# Patient Record
Sex: Female | Born: 1935
Health system: Southern US, Community
[De-identification: ages and names within clinical notes are randomized; demographics above are authoritative.]

## PROBLEM LIST (undated history)

## (undated) DIAGNOSIS — I10 Essential (primary) hypertension: Secondary | ICD-10-CM

## (undated) DIAGNOSIS — F0280 Dementia in other diseases classified elsewhere without behavioral disturbance: Secondary | ICD-10-CM

## (undated) DIAGNOSIS — K754 Autoimmune hepatitis: Secondary | ICD-10-CM

## (undated) DIAGNOSIS — E119 Type 2 diabetes mellitus without complications: Secondary | ICD-10-CM

## (undated) DIAGNOSIS — G309 Alzheimer's disease, unspecified: Secondary | ICD-10-CM

## (undated) DIAGNOSIS — M810 Age-related osteoporosis without current pathological fracture: Secondary | ICD-10-CM

## (undated) HISTORY — DX: Essential (primary) hypertension: I10

## (undated) HISTORY — DX: Age-related osteoporosis without current pathological fracture: M81.0

## (undated) HISTORY — DX: Dementia in other diseases classified elsewhere without behavioral disturbance: F02.80

## (undated) HISTORY — DX: Type 2 diabetes mellitus without complications: E11.9

## (undated) HISTORY — DX: Alzheimer's disease, unspecified: G30.9

## (undated) HISTORY — DX: Autoimmune hepatitis: K75.4

---

## 2011-05-04 DIAGNOSIS — E7800 Pure hypercholesterolemia, unspecified: Secondary | ICD-10-CM | POA: Diagnosis not present

## 2011-05-04 DIAGNOSIS — E119 Type 2 diabetes mellitus without complications: Secondary | ICD-10-CM | POA: Diagnosis not present

## 2011-05-04 DIAGNOSIS — Z124 Encounter for screening for malignant neoplasm of cervix: Secondary | ICD-10-CM | POA: Diagnosis not present

## 2011-06-14 DIAGNOSIS — H524 Presbyopia: Secondary | ICD-10-CM | POA: Diagnosis not present

## 2011-06-14 DIAGNOSIS — Z961 Presence of intraocular lens: Secondary | ICD-10-CM | POA: Diagnosis not present

## 2011-06-14 DIAGNOSIS — H35349 Macular cyst, hole, or pseudohole, unspecified eye: Secondary | ICD-10-CM | POA: Diagnosis not present

## 2011-08-03 DIAGNOSIS — H43819 Vitreous degeneration, unspecified eye: Secondary | ICD-10-CM | POA: Diagnosis not present

## 2011-08-03 DIAGNOSIS — H35349 Macular cyst, hole, or pseudohole, unspecified eye: Secondary | ICD-10-CM | POA: Diagnosis not present

## 2011-08-07 DIAGNOSIS — E7800 Pure hypercholesterolemia, unspecified: Secondary | ICD-10-CM | POA: Diagnosis not present

## 2011-08-07 DIAGNOSIS — I10 Essential (primary) hypertension: Secondary | ICD-10-CM | POA: Diagnosis not present

## 2011-08-07 DIAGNOSIS — E119 Type 2 diabetes mellitus without complications: Secondary | ICD-10-CM | POA: Diagnosis not present

## 2012-01-10 DIAGNOSIS — N952 Postmenopausal atrophic vaginitis: Secondary | ICD-10-CM | POA: Diagnosis not present

## 2012-01-10 DIAGNOSIS — Z1212 Encounter for screening for malignant neoplasm of rectum: Secondary | ICD-10-CM | POA: Diagnosis not present

## 2012-01-10 DIAGNOSIS — Z9189 Other specified personal risk factors, not elsewhere classified: Secondary | ICD-10-CM | POA: Diagnosis not present

## 2012-02-08 DIAGNOSIS — Z1231 Encounter for screening mammogram for malignant neoplasm of breast: Secondary | ICD-10-CM | POA: Diagnosis not present

## 2012-02-23 DIAGNOSIS — E119 Type 2 diabetes mellitus without complications: Secondary | ICD-10-CM | POA: Diagnosis not present

## 2012-02-23 DIAGNOSIS — R142 Eructation: Secondary | ICD-10-CM | POA: Diagnosis not present

## 2012-02-23 DIAGNOSIS — R7989 Other specified abnormal findings of blood chemistry: Secondary | ICD-10-CM | POA: Diagnosis not present

## 2012-02-23 DIAGNOSIS — R141 Gas pain: Secondary | ICD-10-CM | POA: Diagnosis not present

## 2012-02-23 DIAGNOSIS — N390 Urinary tract infection, site not specified: Secondary | ICD-10-CM | POA: Diagnosis not present

## 2012-02-23 DIAGNOSIS — I10 Essential (primary) hypertension: Secondary | ICD-10-CM | POA: Diagnosis not present

## 2012-02-26 DIAGNOSIS — R17 Unspecified jaundice: Secondary | ICD-10-CM | POA: Diagnosis not present

## 2012-02-26 DIAGNOSIS — E119 Type 2 diabetes mellitus without complications: Secondary | ICD-10-CM | POA: Diagnosis not present

## 2012-02-26 DIAGNOSIS — K769 Liver disease, unspecified: Secondary | ICD-10-CM | POA: Diagnosis not present

## 2012-02-26 DIAGNOSIS — R799 Abnormal finding of blood chemistry, unspecified: Secondary | ICD-10-CM | POA: Diagnosis not present

## 2012-02-26 DIAGNOSIS — K7689 Other specified diseases of liver: Secondary | ICD-10-CM | POA: Diagnosis not present

## 2012-02-26 DIAGNOSIS — Z79899 Other long term (current) drug therapy: Secondary | ICD-10-CM | POA: Diagnosis not present

## 2012-02-26 DIAGNOSIS — R413 Other amnesia: Secondary | ICD-10-CM | POA: Diagnosis not present

## 2012-02-26 DIAGNOSIS — Z01818 Encounter for other preprocedural examination: Secondary | ICD-10-CM | POA: Diagnosis not present

## 2012-02-26 DIAGNOSIS — K7200 Acute and subacute hepatic failure without coma: Secondary | ICD-10-CM | POA: Diagnosis not present

## 2012-02-27 DIAGNOSIS — K8020 Calculus of gallbladder without cholecystitis without obstruction: Secondary | ICD-10-CM | POA: Diagnosis not present

## 2012-02-27 DIAGNOSIS — K5730 Diverticulosis of large intestine without perforation or abscess without bleeding: Secondary | ICD-10-CM | POA: Diagnosis not present

## 2012-02-27 DIAGNOSIS — K7200 Acute and subacute hepatic failure without coma: Secondary | ICD-10-CM | POA: Diagnosis not present

## 2012-02-27 DIAGNOSIS — K7689 Other specified diseases of liver: Secondary | ICD-10-CM | POA: Diagnosis not present

## 2012-02-27 DIAGNOSIS — R17 Unspecified jaundice: Secondary | ICD-10-CM | POA: Diagnosis not present

## 2012-02-27 DIAGNOSIS — K769 Liver disease, unspecified: Secondary | ICD-10-CM | POA: Diagnosis not present

## 2012-02-27 DIAGNOSIS — Z79899 Other long term (current) drug therapy: Secondary | ICD-10-CM | POA: Diagnosis not present

## 2012-02-27 DIAGNOSIS — K449 Diaphragmatic hernia without obstruction or gangrene: Secondary | ICD-10-CM | POA: Diagnosis not present

## 2012-02-27 DIAGNOSIS — R413 Other amnesia: Secondary | ICD-10-CM | POA: Diagnosis not present

## 2012-02-27 DIAGNOSIS — D259 Leiomyoma of uterus, unspecified: Secondary | ICD-10-CM | POA: Diagnosis not present

## 2012-02-29 DIAGNOSIS — Z01818 Encounter for other preprocedural examination: Secondary | ICD-10-CM | POA: Diagnosis not present

## 2012-02-29 DIAGNOSIS — K8020 Calculus of gallbladder without cholecystitis without obstruction: Secondary | ICD-10-CM | POA: Diagnosis not present

## 2012-02-29 DIAGNOSIS — K769 Liver disease, unspecified: Secondary | ICD-10-CM | POA: Diagnosis not present

## 2012-02-29 DIAGNOSIS — R935 Abnormal findings on diagnostic imaging of other abdominal regions, including retroperitoneum: Secondary | ICD-10-CM | POA: Diagnosis not present

## 2012-02-29 DIAGNOSIS — R17 Unspecified jaundice: Secondary | ICD-10-CM | POA: Diagnosis not present

## 2012-02-29 DIAGNOSIS — Z79899 Other long term (current) drug therapy: Secondary | ICD-10-CM | POA: Diagnosis not present

## 2012-02-29 DIAGNOSIS — R7989 Other specified abnormal findings of blood chemistry: Secondary | ICD-10-CM | POA: Diagnosis not present

## 2012-03-04 DIAGNOSIS — K754 Autoimmune hepatitis: Secondary | ICD-10-CM | POA: Diagnosis not present

## 2012-03-04 DIAGNOSIS — R17 Unspecified jaundice: Secondary | ICD-10-CM | POA: Diagnosis not present

## 2012-03-04 DIAGNOSIS — R945 Abnormal results of liver function studies: Secondary | ICD-10-CM | POA: Diagnosis not present

## 2012-03-11 DIAGNOSIS — K754 Autoimmune hepatitis: Secondary | ICD-10-CM | POA: Diagnosis not present

## 2012-03-11 DIAGNOSIS — E119 Type 2 diabetes mellitus without complications: Secondary | ICD-10-CM | POA: Diagnosis not present

## 2012-03-11 DIAGNOSIS — E785 Hyperlipidemia, unspecified: Secondary | ICD-10-CM | POA: Diagnosis not present

## 2012-03-11 DIAGNOSIS — Z23 Encounter for immunization: Secondary | ICD-10-CM | POA: Diagnosis not present

## 2012-03-11 DIAGNOSIS — Z1331 Encounter for screening for depression: Secondary | ICD-10-CM | POA: Diagnosis not present

## 2012-03-11 DIAGNOSIS — Z0000 Encounter for general adult medical examination without abnormal findings: Secondary | ICD-10-CM | POA: Diagnosis not present

## 2012-03-13 DIAGNOSIS — K754 Autoimmune hepatitis: Secondary | ICD-10-CM | POA: Diagnosis not present

## 2012-03-13 DIAGNOSIS — Z23 Encounter for immunization: Secondary | ICD-10-CM | POA: Diagnosis not present

## 2012-03-13 DIAGNOSIS — E119 Type 2 diabetes mellitus without complications: Secondary | ICD-10-CM | POA: Diagnosis not present

## 2012-03-13 DIAGNOSIS — Z1331 Encounter for screening for depression: Secondary | ICD-10-CM | POA: Diagnosis not present

## 2012-03-13 DIAGNOSIS — Z0000 Encounter for general adult medical examination without abnormal findings: Secondary | ICD-10-CM | POA: Diagnosis not present

## 2012-03-13 DIAGNOSIS — E785 Hyperlipidemia, unspecified: Secondary | ICD-10-CM | POA: Diagnosis not present

## 2012-03-18 DIAGNOSIS — K754 Autoimmune hepatitis: Secondary | ICD-10-CM | POA: Diagnosis not present

## 2012-03-19 DIAGNOSIS — K754 Autoimmune hepatitis: Secondary | ICD-10-CM | POA: Diagnosis not present

## 2012-03-25 DIAGNOSIS — K754 Autoimmune hepatitis: Secondary | ICD-10-CM | POA: Diagnosis not present

## 2012-03-25 DIAGNOSIS — Z79899 Other long term (current) drug therapy: Secondary | ICD-10-CM | POA: Diagnosis not present

## 2012-04-09 DIAGNOSIS — K754 Autoimmune hepatitis: Secondary | ICD-10-CM | POA: Diagnosis not present

## 2012-04-15 DIAGNOSIS — K754 Autoimmune hepatitis: Secondary | ICD-10-CM | POA: Diagnosis not present

## 2012-04-29 DIAGNOSIS — K754 Autoimmune hepatitis: Secondary | ICD-10-CM | POA: Diagnosis not present

## 2012-05-13 DIAGNOSIS — I10 Essential (primary) hypertension: Secondary | ICD-10-CM | POA: Diagnosis not present

## 2012-05-13 DIAGNOSIS — Z79899 Other long term (current) drug therapy: Secondary | ICD-10-CM | POA: Diagnosis not present

## 2012-05-16 DIAGNOSIS — K754 Autoimmune hepatitis: Secondary | ICD-10-CM | POA: Diagnosis not present

## 2012-06-19 DIAGNOSIS — E785 Hyperlipidemia, unspecified: Secondary | ICD-10-CM | POA: Diagnosis not present

## 2012-06-19 DIAGNOSIS — E119 Type 2 diabetes mellitus without complications: Secondary | ICD-10-CM | POA: Diagnosis not present

## 2012-06-21 DIAGNOSIS — H524 Presbyopia: Secondary | ICD-10-CM | POA: Diagnosis not present

## 2012-07-24 DIAGNOSIS — E785 Hyperlipidemia, unspecified: Secondary | ICD-10-CM | POA: Diagnosis not present

## 2012-07-24 DIAGNOSIS — E119 Type 2 diabetes mellitus without complications: Secondary | ICD-10-CM | POA: Diagnosis not present

## 2012-07-24 DIAGNOSIS — G309 Alzheimer's disease, unspecified: Secondary | ICD-10-CM | POA: Diagnosis not present

## 2012-07-31 DIAGNOSIS — K754 Autoimmune hepatitis: Secondary | ICD-10-CM | POA: Diagnosis not present

## 2012-08-01 DIAGNOSIS — E119 Type 2 diabetes mellitus without complications: Secondary | ICD-10-CM | POA: Diagnosis not present

## 2012-08-01 DIAGNOSIS — Z9889 Other specified postprocedural states: Secondary | ICD-10-CM | POA: Diagnosis not present

## 2012-08-01 DIAGNOSIS — H35349 Macular cyst, hole, or pseudohole, unspecified eye: Secondary | ICD-10-CM | POA: Diagnosis not present

## 2012-08-19 DIAGNOSIS — E559 Vitamin D deficiency, unspecified: Secondary | ICD-10-CM | POA: Diagnosis not present

## 2012-08-19 DIAGNOSIS — Z79899 Other long term (current) drug therapy: Secondary | ICD-10-CM | POA: Diagnosis not present

## 2012-08-29 DIAGNOSIS — E1149 Type 2 diabetes mellitus with other diabetic neurological complication: Secondary | ICD-10-CM | POA: Diagnosis not present

## 2012-08-29 DIAGNOSIS — E1129 Type 2 diabetes mellitus with other diabetic kidney complication: Secondary | ICD-10-CM | POA: Diagnosis not present

## 2012-08-29 DIAGNOSIS — E7800 Pure hypercholesterolemia, unspecified: Secondary | ICD-10-CM | POA: Diagnosis not present

## 2012-08-29 DIAGNOSIS — F0280 Dementia in other diseases classified elsewhere without behavioral disturbance: Secondary | ICD-10-CM | POA: Diagnosis not present

## 2012-10-02 DIAGNOSIS — R809 Proteinuria, unspecified: Secondary | ICD-10-CM | POA: Diagnosis not present

## 2012-10-08 DIAGNOSIS — K754 Autoimmune hepatitis: Secondary | ICD-10-CM | POA: Diagnosis not present

## 2012-11-18 DIAGNOSIS — K754 Autoimmune hepatitis: Secondary | ICD-10-CM | POA: Diagnosis not present

## 2012-11-18 DIAGNOSIS — D126 Benign neoplasm of colon, unspecified: Secondary | ICD-10-CM | POA: Diagnosis not present

## 2012-11-29 DIAGNOSIS — Z23 Encounter for immunization: Secondary | ICD-10-CM | POA: Diagnosis not present

## 2012-11-29 DIAGNOSIS — G4700 Insomnia, unspecified: Secondary | ICD-10-CM | POA: Diagnosis not present

## 2012-11-29 DIAGNOSIS — E785 Hyperlipidemia, unspecified: Secondary | ICD-10-CM | POA: Diagnosis not present

## 2012-11-29 DIAGNOSIS — E1129 Type 2 diabetes mellitus with other diabetic kidney complication: Secondary | ICD-10-CM | POA: Diagnosis not present

## 2013-01-15 DIAGNOSIS — E1129 Type 2 diabetes mellitus with other diabetic kidney complication: Secondary | ICD-10-CM | POA: Diagnosis not present

## 2013-01-15 DIAGNOSIS — Z79899 Other long term (current) drug therapy: Secondary | ICD-10-CM | POA: Diagnosis not present

## 2013-01-15 DIAGNOSIS — K754 Autoimmune hepatitis: Secondary | ICD-10-CM | POA: Diagnosis not present

## 2013-02-11 DIAGNOSIS — Z1231 Encounter for screening mammogram for malignant neoplasm of breast: Secondary | ICD-10-CM | POA: Diagnosis not present

## 2013-02-11 DIAGNOSIS — M810 Age-related osteoporosis without current pathological fracture: Secondary | ICD-10-CM | POA: Diagnosis not present

## 2013-02-13 DIAGNOSIS — K754 Autoimmune hepatitis: Secondary | ICD-10-CM | POA: Diagnosis not present

## 2013-02-13 DIAGNOSIS — K5730 Diverticulosis of large intestine without perforation or abscess without bleeding: Secondary | ICD-10-CM | POA: Diagnosis not present

## 2013-02-13 DIAGNOSIS — D126 Benign neoplasm of colon, unspecified: Secondary | ICD-10-CM | POA: Diagnosis not present

## 2013-02-20 DIAGNOSIS — M810 Age-related osteoporosis without current pathological fracture: Secondary | ICD-10-CM | POA: Diagnosis not present

## 2013-02-25 DIAGNOSIS — K769 Liver disease, unspecified: Secondary | ICD-10-CM | POA: Diagnosis not present

## 2013-02-26 DIAGNOSIS — K5730 Diverticulosis of large intestine without perforation or abscess without bleeding: Secondary | ICD-10-CM | POA: Diagnosis not present

## 2013-02-26 DIAGNOSIS — D126 Benign neoplasm of colon, unspecified: Secondary | ICD-10-CM | POA: Diagnosis not present

## 2013-02-26 DIAGNOSIS — K754 Autoimmune hepatitis: Secondary | ICD-10-CM | POA: Diagnosis not present

## 2013-03-04 DIAGNOSIS — Z1212 Encounter for screening for malignant neoplasm of rectum: Secondary | ICD-10-CM | POA: Diagnosis not present

## 2013-03-04 DIAGNOSIS — E119 Type 2 diabetes mellitus without complications: Secondary | ICD-10-CM | POA: Diagnosis not present

## 2013-03-04 DIAGNOSIS — Z01419 Encounter for gynecological examination (general) (routine) without abnormal findings: Secondary | ICD-10-CM | POA: Diagnosis not present

## 2013-03-13 DIAGNOSIS — M810 Age-related osteoporosis without current pathological fracture: Secondary | ICD-10-CM | POA: Diagnosis not present

## 2013-03-21 DIAGNOSIS — Z1331 Encounter for screening for depression: Secondary | ICD-10-CM | POA: Diagnosis not present

## 2013-03-21 DIAGNOSIS — F0280 Dementia in other diseases classified elsewhere without behavioral disturbance: Secondary | ICD-10-CM | POA: Diagnosis not present

## 2013-03-21 DIAGNOSIS — Z0000 Encounter for general adult medical examination without abnormal findings: Secondary | ICD-10-CM | POA: Diagnosis not present

## 2013-03-21 DIAGNOSIS — K754 Autoimmune hepatitis: Secondary | ICD-10-CM | POA: Diagnosis not present

## 2013-03-21 DIAGNOSIS — I10 Essential (primary) hypertension: Secondary | ICD-10-CM | POA: Diagnosis not present

## 2013-03-21 DIAGNOSIS — K769 Liver disease, unspecified: Secondary | ICD-10-CM | POA: Diagnosis not present

## 2013-03-21 DIAGNOSIS — E1129 Type 2 diabetes mellitus with other diabetic kidney complication: Secondary | ICD-10-CM | POA: Diagnosis not present

## 2013-04-21 DIAGNOSIS — G309 Alzheimer's disease, unspecified: Secondary | ICD-10-CM | POA: Diagnosis not present

## 2013-04-21 DIAGNOSIS — F0280 Dementia in other diseases classified elsewhere without behavioral disturbance: Secondary | ICD-10-CM | POA: Diagnosis not present

## 2013-05-19 DIAGNOSIS — F0280 Dementia in other diseases classified elsewhere without behavioral disturbance: Secondary | ICD-10-CM | POA: Diagnosis not present

## 2013-05-23 DIAGNOSIS — K5730 Diverticulosis of large intestine without perforation or abscess without bleeding: Secondary | ICD-10-CM | POA: Diagnosis not present

## 2013-05-23 DIAGNOSIS — K754 Autoimmune hepatitis: Secondary | ICD-10-CM | POA: Diagnosis not present

## 2013-05-23 DIAGNOSIS — D126 Benign neoplasm of colon, unspecified: Secondary | ICD-10-CM | POA: Diagnosis not present

## 2013-05-23 LAB — CBC AND DIFFERENTIAL
HEMOGLOBIN: 14.6 g/dL (ref 12.0–16.0)
Platelets: 202 10*3/uL (ref 150–399)
WBC: 7.2 10*3/mL

## 2013-05-23 LAB — BASIC METABOLIC PANEL
Creatinine: 1.1 mg/dL (ref 0.5–1.1)
Glucose: 170 mg/dL
Potassium: 4.1 mmol/L (ref 3.4–5.3)

## 2013-05-23 LAB — HEPATIC FUNCTION PANEL
ALT: 18 U/L (ref 7–35)
AST: 14 U/L (ref 13–35)
Bilirubin, Total: 0.5 mg/dL

## 2013-05-23 LAB — HEMOGLOBIN A1C: Hgb A1c MFr Bld: 6.1 % — AB (ref 4.0–6.0)

## 2013-11-21 ENCOUNTER — Ambulatory Visit (INDEPENDENT_AMBULATORY_CARE_PROVIDER_SITE_OTHER): Payer: Medicare Other | Admitting: Family Medicine

## 2013-11-21 ENCOUNTER — Encounter: Payer: Self-pay | Admitting: Family Medicine

## 2013-11-21 ENCOUNTER — Telehealth: Payer: Self-pay | Admitting: *Deleted

## 2013-11-21 VITALS — BP 144/84 | HR 83 | Wt 161.0 lb

## 2013-11-21 DIAGNOSIS — I10 Essential (primary) hypertension: Secondary | ICD-10-CM

## 2013-11-21 DIAGNOSIS — F0280 Dementia in other diseases classified elsewhere without behavioral disturbance: Secondary | ICD-10-CM

## 2013-11-21 DIAGNOSIS — K754 Autoimmune hepatitis: Secondary | ICD-10-CM

## 2013-11-21 DIAGNOSIS — Z79624 Long term (current) use of inhibitors of nucleotide synthesis: Secondary | ICD-10-CM

## 2013-11-21 DIAGNOSIS — M810 Age-related osteoporosis without current pathological fracture: Secondary | ICD-10-CM

## 2013-11-21 DIAGNOSIS — N058 Unspecified nephritic syndrome with other morphologic changes: Secondary | ICD-10-CM

## 2013-11-21 DIAGNOSIS — E119 Type 2 diabetes mellitus without complications: Secondary | ICD-10-CM

## 2013-11-21 DIAGNOSIS — G309 Alzheimer's disease, unspecified: Secondary | ICD-10-CM

## 2013-11-21 DIAGNOSIS — E1121 Type 2 diabetes mellitus with diabetic nephropathy: Secondary | ICD-10-CM

## 2013-11-21 DIAGNOSIS — G4700 Insomnia, unspecified: Secondary | ICD-10-CM

## 2013-11-21 DIAGNOSIS — E1129 Type 2 diabetes mellitus with other diabetic kidney complication: Secondary | ICD-10-CM

## 2013-11-21 DIAGNOSIS — Z79899 Other long term (current) drug therapy: Secondary | ICD-10-CM

## 2013-11-21 HISTORY — DX: Type 2 diabetes mellitus without complications: E11.9

## 2013-11-21 HISTORY — DX: Dementia in other diseases classified elsewhere, unspecified severity, without behavioral disturbance, psychotic disturbance, mood disturbance, and anxiety: F02.80

## 2013-11-21 HISTORY — DX: Essential (primary) hypertension: I10

## 2013-11-21 HISTORY — DX: Alzheimer's disease, unspecified: G30.9

## 2013-11-21 HISTORY — DX: Autoimmune hepatitis: K75.4

## 2013-11-21 HISTORY — DX: Age-related osteoporosis without current pathological fracture: M81.0

## 2013-11-21 NOTE — Telephone Encounter (Signed)
Pt's name was entered incorrectly on chart so had to reorder labs with correct orders

## 2013-11-21 NOTE — Progress Notes (Signed)
CC: Danielle Mcdowell is a 80 y.o. female is here for Establish Care   Subjective: HPI:  Pleasant 80 year old here to establish care, she is overall in familiar with her past medical history however she does bring in most recent office notes from her physician in [place]  History autoimmune hepatitis currently on azathioprine. She's unsure how long she's been on this medication denies any known intolerance or side effects. She believes that her hepatitis was found on routine blood work and that she was never symptomatic. She denies jaundice, right upper quadrant pain nausea nor unintentional weight loss. She believes it's been over 6 months and blood work was obtained last  History of type 2 diabetes: No outside blood sugars to report. Outside records reflect she's got diabetic nephropathy but she does not believe she has any other complications due to 2 diabetes. Currently not on any antihyperglycemic medication and she is uncertain when she was last taking any anti-hyperglycemics. Denies polyuria polyphasia or polydipsia  History of Alzheimer's disease: Currently taking donepezil on a daily basis without known side effects or known intolerance. She is independent on all ADLs but lives with her daughter here in [place]. She recognizes that she has a problem with memory but does not interfere with her quality of life  History of essential hypertension: She is uncertain how long she's had this, no outside blood pressures to report. She's been taking Cozaar for an unknown time and currently takes on a daily basis.  History of insomnia: She's been prescribed trazodone in the past which she's used for insomnia but states currently she is not having any difficulty with insomnia, from what she recalls it sounds like she's not taking trazodone currently  History of osteoporosis with an allergy to Fosamax, unknown reaction. She is uncertain about most recent DEXA scan, we are requesting outside  records  Review of Systems - General ROS: negative for - chills, fever, night sweats, weight gain or weight loss Ophthalmic ROS: negative for - decreased vision Psychological ROS: negative for - anxiety or depression ENT ROS: negative for - hearing change, nasal congestion, tinnitus or allergies Hematological and Lymphatic ROS: negative for - bleeding problems, bruising or swollen lymph nodes Breast ROS: negative Respiratory ROS: no cough, shortness of breath, or wheezing Cardiovascular ROS: no chest pain or dyspnea on exertion Gastrointestinal ROS: no abdominal pain, change in bowel habits, or black or bloody stools Genito-Urinary ROS: negative for - genital discharge, genital ulcers, incontinence or abnormal bleeding from genitals Musculoskeletal ROS: negative for - joint pain or muscle pain Neurological ROS: negative for - headaches or memory loss Dermatological ROS: negative for lumps, mole changes, rash and skin lesion changes  Past Medical History  Diagnosis Date  . Osteoporosis 11/21/2013  . Type 2 diabetes mellitus 11/21/2013    History reviewed. No pertinent past surgical history. Family History  Problem Relation Age of Onset  . Diabetes      History   Social History  . Marital Status: Widowed    Spouse Name: N/A    Number of Children: N/A  . Years of Education: N/A   Occupational History  . Not on file.   Social History Main Topics  . Smoking status: Never Smoker   . Smokeless tobacco: Not on file  . Alcohol Use: Not on file  . Drug Use: No  . Sexual Activity: No   Other Topics Concern  . Not on file   Social History Narrative  . No narrative on file  Objective: BP 144/84  Pulse 83  Wt 161 lb (73.029 kg)  General: Alert and Oriented, No Acute Distress HEENT: Pupils equal, round, reactive to light. Conjunctivae clear.  Moist mucous membranes pharynx unremarkable Lungs: Clear to auscultation bilaterally, no wheezing/ronchi/rales.  Comfortable work of  breathing. Good air movement. Cardiac: Regular rate and rhythm. Normal S1/S2.  No murmurs, rubs, nor gallops.   Abdomen: Soft nontender Extremities: No peripheral edema.  Strong peripheral pulses.  Mental Status: No depression, anxiety, nor agitation. Skin: Warm and dry.  Assessment & Plan: Danielle Mcdowell was seen today for establish care.  Diagnoses and associated orders for this visit:  Autoimmune hepatitis - COMPLETE METABOLIC PANEL WITH GFR - CBC  Type 2 diabetes mellitus without complication - HgB L5B  Encounter for long term current azathioprine therapy - CBC  Alzheimer's disease  Diabetic nephropathy associated with type 2 diabetes mellitus  Essential hypertension, benign  Insomnia  Osteoporosis    Autoimmune hepatitis: Clinically stable, Checking liver function today along with CBC given use of azathioprine. Type 2 diabetes: Clinically stable due for A1c, checking renal function Alzheimer's disease: Stable, Continue donepezil Essential hypertension: Controlled today in the office, before increasing losartan I would like to see outside blood pressures at home and in her former records Insomnia: Stable continue as needed trazodone Osteoporosis: Clinically stable but requesting outside records for most recent DEXA scan    Return in about 3 months (around 02/21/2014) for Routine BP Follow Up.

## 2013-11-22 LAB — COMPLETE METABOLIC PANEL WITH GFR
ALT: 9 U/L (ref 0–35)
AST: 14 U/L (ref 0–37)
Albumin: 4.2 g/dL (ref 3.5–5.2)
Alkaline Phosphatase: 61 U/L (ref 39–117)
BUN: 12 mg/dL (ref 6–23)
CO2: 25 mEq/L (ref 19–32)
Calcium: 9.6 mg/dL (ref 8.4–10.5)
Chloride: 104 mEq/L (ref 96–112)
Creat: 1.04 mg/dL (ref 0.50–1.10)
GFR, EST NON AFRICAN AMERICAN: 51 mL/min — AB
GFR, Est African American: 59 mL/min — ABNORMAL LOW
GLUCOSE: 83 mg/dL (ref 70–99)
Potassium: 4 mEq/L (ref 3.5–5.3)
Sodium: 140 mEq/L (ref 135–145)
Total Bilirubin: 0.5 mg/dL (ref 0.2–1.2)
Total Protein: 7.2 g/dL (ref 6.0–8.3)

## 2013-11-22 LAB — CBC
HCT: 42.9 % (ref 36.0–46.0)
Hemoglobin: 14.9 g/dL (ref 12.0–15.0)
MCH: 30.7 pg (ref 26.0–34.0)
MCHC: 34.7 g/dL (ref 30.0–36.0)
MCV: 88.3 fL (ref 78.0–100.0)
PLATELETS: 224 10*3/uL (ref 150–400)
RBC: 4.86 MIL/uL (ref 3.87–5.11)
RDW: 13.7 % (ref 11.5–15.5)
WBC: 10.1 10*3/uL (ref 4.0–10.5)

## 2013-11-22 LAB — HEMOGLOBIN A1C
HEMOGLOBIN A1C: 6.4 % — AB (ref <5.7)
MEAN PLASMA GLUCOSE: 137 mg/dL — AB (ref <117)

## 2013-11-24 ENCOUNTER — Encounter: Payer: Self-pay | Admitting: Family Medicine

## 2013-11-24 DIAGNOSIS — E1129 Type 2 diabetes mellitus with other diabetic kidney complication: Secondary | ICD-10-CM | POA: Insufficient documentation

## 2013-11-25 ENCOUNTER — Encounter: Payer: Self-pay | Admitting: Family Medicine

## 2013-11-25 DIAGNOSIS — E785 Hyperlipidemia, unspecified: Secondary | ICD-10-CM | POA: Insufficient documentation

## 2013-11-25 DIAGNOSIS — D126 Benign neoplasm of colon, unspecified: Secondary | ICD-10-CM | POA: Insufficient documentation

## 2013-12-05 ENCOUNTER — Encounter: Payer: Self-pay | Admitting: Family Medicine

## 2013-12-08 ENCOUNTER — Telehealth: Payer: Self-pay | Admitting: *Deleted

## 2013-12-08 DIAGNOSIS — G309 Alzheimer's disease, unspecified: Principal | ICD-10-CM

## 2013-12-08 DIAGNOSIS — F0280 Dementia in other diseases classified elsewhere without behavioral disturbance: Secondary | ICD-10-CM

## 2013-12-08 MED ORDER — DONEPEZIL HCL 5 MG PO TABS: 5.0000 mg | ORAL_TABLET | Freq: Every day | ORAL | Status: DC

## 2013-12-08 NOTE — Telephone Encounter (Signed)
rx sent to [facility] on file

## 2013-12-08 NOTE — Telephone Encounter (Signed)
Pt request refill of aricept. Routing this to provider since it will be the first time our office fills it

## 2013-12-09 NOTE — Telephone Encounter (Signed)
Left detailed advising patient refill was sent to pharmacy.

## 2014-02-18 ENCOUNTER — Ambulatory Visit: Payer: PRIVATE HEALTH INSURANCE | Admitting: Family Medicine

## 2014-02-23 ENCOUNTER — Ambulatory Visit: Payer: PRIVATE HEALTH INSURANCE | Admitting: Family Medicine

## 2014-02-24 ENCOUNTER — Encounter: Payer: Self-pay | Admitting: Family Medicine

## 2014-02-24 ENCOUNTER — Ambulatory Visit (INDEPENDENT_AMBULATORY_CARE_PROVIDER_SITE_OTHER): Payer: Medicare Other | Admitting: *Deleted

## 2014-02-24 ENCOUNTER — Ambulatory Visit (INDEPENDENT_AMBULATORY_CARE_PROVIDER_SITE_OTHER): Payer: Medicare Other | Admitting: Family Medicine

## 2014-02-24 VITALS — BP 141/81 | HR 71 | Wt 161.0 lb

## 2014-02-24 DIAGNOSIS — E1129 Type 2 diabetes mellitus with other diabetic kidney complication: Secondary | ICD-10-CM

## 2014-02-24 DIAGNOSIS — N058 Unspecified nephritic syndrome with other morphologic changes: Secondary | ICD-10-CM | POA: Diagnosis not present

## 2014-02-24 DIAGNOSIS — Z23 Encounter for immunization: Secondary | ICD-10-CM

## 2014-02-24 DIAGNOSIS — G309 Alzheimer's disease, unspecified: Secondary | ICD-10-CM

## 2014-02-24 DIAGNOSIS — F0280 Dementia in other diseases classified elsewhere without behavioral disturbance: Secondary | ICD-10-CM

## 2014-02-24 DIAGNOSIS — I10 Essential (primary) hypertension: Secondary | ICD-10-CM

## 2014-02-24 LAB — POCT GLYCOSYLATED HEMOGLOBIN (HGB A1C): Hemoglobin A1C: 6.4

## 2014-02-24 MED ORDER — MEMANTINE HCL ER 28 MG PO CP24: 1.0000 | ORAL_CAPSULE | Freq: Every day | ORAL | Status: DC

## 2014-02-24 MED ORDER — MEMANTINE HCL ER 7 & 14 & 21 &28 MG PO CP24: 1.0000 | ORAL_CAPSULE | Freq: Every day | ORAL | Status: DC

## 2014-02-24 NOTE — Progress Notes (Signed)
CC: Danielle Mcdowell is a 80 y.o. female is here for Hypertension and Diabetes   Subjective: HPI:  Follow-up essential hypertension: she is prescribed losartan, her daughter is now taking care of her medications. There's been some confusion about what she should be taking and not taking. It's not clear if she's been taking losartan on a daily basis. She denies chest pain shortness of breath orthopnea nor motor or sensory disturbances other than that described below  Follow-up Alzheimer's disease: The daughter tells me that she found a old prescription of Aricept 5 mg to be taken twice a day, at the last visit this was prescribed by myself for 5 mg only once a day. Review of her old records reveal that when she was taking a total of 10 mg a day she had GI disturbance and intolerance. She does not have any of this at 5 mg a day. It is my understanding that the months prior to establishing with me she was taking only 5 mg a day. Currently she is only taking 5 mg a day. The patient notes that she is having subjective worsening with her short-term memory and remembering recent conversations. When records were reviewed after her last visit it appears that she was prescribed Namenda XR 28 mg daily. This was not on her medication list when she established with me and she's not been taking it for at least 3 months now. She denies any known intolerance  Follow-up type 2 diabetes: No current antihyperglycemic medication. No outside blood sugars to report. There has been no polyuria or polyphagia or polydipsia  Review Of Systems Outlined In HPI  Past Medical History  Diagnosis Date  . Osteoporosis 11/21/2013  . Type 2 diabetes mellitus 11/21/2013    No past surgical history on file. Family History  Problem Relation Age of Onset  . Diabetes      History   Social History  . Marital Status: Widowed    Spouse Name: N/A    Number of Children: N/A  . Years of Education: N/A   Occupational History  .  Not on file.   Social History Main Topics  . Smoking status: Never Smoker   . Smokeless tobacco: Not on file  . Alcohol Use: Not on file  . Drug Use: No  . Sexual Activity: No   Other Topics Concern  . Not on file   Social History Narrative     Objective: BP 141/81 mmHg  Pulse 71  Wt 161 lb (73.029 kg)  General: Alert and Oriented, No Acute Distress HEENT: Pupils equal, round, reactive to light. Conjunctivae clear.  Moist mucous membranespharynxunremarkable. Lungs: Clear to auscultation bilaterally, no wheezing/ronchi/rales.  Comfortable work of breathing. Good air movement. Cardiac: Regular rate and rhythm. Normal S1/S2.  No murmurs, rubs, nor gallops.   Extremities: No peripheral edema.  Strong peripheral pulses.  Mental Status: No depression, anxiety, nor agitation. Skin: Warm and dry.  Assessment & Plan: Danielle Mcdowell was seen today for hypertension and diabetes.  Diagnoses and associated orders for this visit:  Type 2 diabetes mellitus with renal manifestations, controlled - POCT HgB A1C  Alzheimer's disease - Memantine HCl ER (NAMENDA XR TITRATION PACK) 7 & 14 & 21 &28 MG CP24; Take 1 capsule by mouth daily. Per starter pack instructions.  Once finished begin new Rx of 28mg  daily. - Memantine HCl ER (NAMENDA XR) 28 MG CP24; Take 28 mg by mouth daily.  Essential hypertension, benign    Essential hypertension: Uncontrolled, discussed with daughter  that losartan 25 mg should be taken on a daily basis.  Return in one month for blood pressure recheck. Alzheimer's disease: Uncontrolled restart former Namenda X are beginning with titration pack and once completed begin 28 mg on a daily basis. Type 2 diabetes: A1c 6.4, controlled, no current need for anti-hyperglycemics  She received a flu shot today  Return in about 4 weeks (around 03/24/2014) for Memory.

## 2014-03-24 ENCOUNTER — Ambulatory Visit: Payer: Medicare Other | Admitting: Family Medicine

## 2014-03-25 ENCOUNTER — Ambulatory Visit (INDEPENDENT_AMBULATORY_CARE_PROVIDER_SITE_OTHER): Payer: Medicare Other | Admitting: Family Medicine

## 2014-03-25 ENCOUNTER — Encounter: Payer: Self-pay | Admitting: Family Medicine

## 2014-03-25 VITALS — BP 148/92 | HR 44 | Wt 158.0 lb

## 2014-03-25 DIAGNOSIS — G309 Alzheimer's disease, unspecified: Secondary | ICD-10-CM

## 2014-03-25 DIAGNOSIS — I10 Essential (primary) hypertension: Secondary | ICD-10-CM | POA: Diagnosis not present

## 2014-03-25 DIAGNOSIS — F0280 Dementia in other diseases classified elsewhere without behavioral disturbance: Secondary | ICD-10-CM

## 2014-03-25 DIAGNOSIS — K754 Autoimmune hepatitis: Secondary | ICD-10-CM

## 2014-03-25 MED ORDER — LOSARTAN POTASSIUM 50 MG PO TABS: 50.0000 mg | ORAL_TABLET | Freq: Every day | ORAL | Status: DC

## 2014-03-25 MED ORDER — AZATHIOPRINE 50 MG PO TABS: 50.0000 mg | ORAL_TABLET | Freq: Every day | ORAL | Status: DC

## 2014-03-25 MED ORDER — MEMANTINE HCL 10 MG PO TABS: 10.0000 mg | ORAL_TABLET | Freq: Two times a day (BID) | ORAL | Status: DC

## 2014-03-25 NOTE — Progress Notes (Signed)
CC: Danielle Mcdowell is a 80 y.o. female is here for f/u memory   Subjective: HPI:  Follow-up Alzheimer's disease: She continues on 5 mg of Aricept on a daily basis. She did not start Namenda XR because it was too expensive. She continues to live with her daughter. Patient believes that her memory is doing well. She has no cognitive complaints today. She is still independent in all activities of daily living.  Follow-up essential hypertension: She tells me she's been checking her blood pressure at home it seems to be higher when she is nervous however she does not remember what her numbers have been at home. She confirms that she is taking 25 mg of losartan on a daily basis. She denies chest pain shortness of breath orthopnea nor peripheral edema.  On review of her medications it appears that Imuran is empty and she does not have any refills, the last time this was written was provided by her former physician. Metabolic panel and CBC in August was normal other than mild to moderate renal insufficiency Review Of Systems Outlined In HPI  Past Medical History  Diagnosis Date  . Osteoporosis 11/21/2013  . Type 2 diabetes mellitus 11/21/2013    No past surgical history on file. Family History  Problem Relation Age of Onset  . Diabetes      History   Social History  . Marital Status: Widowed    Spouse Name: N/A    Number of Children: N/A  . Years of Education: N/A   Occupational History  . Not on file.   Social History Main Topics  . Smoking status: Never Smoker   . Smokeless tobacco: Not on file  . Alcohol Use: Not on file  . Drug Use: No  . Sexual Activity: No   Other Topics Concern  . Not on file   Social History Narrative     Objective: BP 148/92 mmHg  Pulse 44  Wt 158 lb (71.668 kg)  General: Alert alert, no acute distress. Unable to recall date, day of week, month, nor year. HEENT: Pupils equal, round, reactive to light. Conjunctivae clear.  Moist mucous  membranes Lungs: Clear to auscultation bilaterally, no wheezing/ronchi/rales.  Comfortable work of breathing. Good air movement. Cardiac: Regular rate and rhythm. Normal S1/S2.  No murmurs, rubs, nor gallops.   Extremities: No peripheral edema.  Strong peripheral pulses.  Mental Status: No depression, anxiety, nor agitation. Skin: Warm and dry.  Assessment & Plan: Danielle Mcdowell was seen today for f/u memory.  Diagnoses and associated orders for this visit:  Alzheimer's disease - memantine (NAMENDA) 10 MG tablet; Take 1 tablet (10 mg total) by mouth 2 (two) times daily. Replaces the XR formulation that was expensive.  Essential hypertension, benign - losartan (COZAAR) 50 MG tablet; Take 1 tablet (50 mg total) by mouth daily. Replaces 25mg  formulation.  Autoimmune hepatitis  Other Orders - azaTHIOprine (IMURAN) 50 MG tablet; Take 1 tablet (50 mg total) by mouth daily.    Ulcerative disease: Uncontrolled, since XR formulation of Namenda is unaffordable will provide her with twice a day dosing of the immediate release formulation. Continue on donepezil 5 mg Essential hypertension: Uncontrolled increasing losartan Autoimmune hepatitis: Clinically controlled refills provided for Imuran with recheck of CBC and metabolic panel when she returns in 2 months.   Return in about 2 months (around 05/26/2014) for BP and Memory.

## 2014-03-31 ENCOUNTER — Telehealth: Payer: Self-pay

## 2014-03-31 DIAGNOSIS — F0280 Dementia in other diseases classified elsewhere without behavioral disturbance: Secondary | ICD-10-CM

## 2014-03-31 DIAGNOSIS — G309 Alzheimer's disease, unspecified: Principal | ICD-10-CM

## 2014-03-31 NOTE — Telephone Encounter (Signed)
Jamie/Andrea, This decrease in Nameda sounds reasonable, can you please relay this advice to the patient's local daughter who helps manage medications with recommendations to increase to two 10mg  tablets daily one week after the decrease as long as it's tolerated by the patient.

## 2014-03-31 NOTE — Telephone Encounter (Signed)
Dr Baldomero Lamy is patient's daughter-in-law. She called to voice concern about Danielle Mcdowell taking Namenda 10 mg twice daily. She states Danielle Mcdowell is still having some dizziness with the Namenda. She would like to recommend decreasing the dose to 10 mg once daily for a couple of weeks and then increase to twice daily. She is concerned Danielle Mcdowell will become frustrated with the dizziness and want to stop taking the Namenda. Dr Aquilla Solian was pleased to know that Dr Ileene Rubens increased her Losartan. If Dr Ileene Rubens would like to speak with Dr Aquilla Solian she is very open to communicating with Dr Ileene Rubens.   [place]: Baldomero Lamy MD [facility] Address: [address] [place] [address] [phone]  Cell phone number [phone]

## 2014-04-01 NOTE — Telephone Encounter (Signed)
Message left on pamela turners vm. ( I think this is the local daughter)

## 2014-05-27 ENCOUNTER — Ambulatory Visit: Payer: Medicare Other | Admitting: Family Medicine

## 2014-11-04 DIAGNOSIS — E785 Hyperlipidemia, unspecified: Secondary | ICD-10-CM | POA: Diagnosis not present

## 2014-11-04 DIAGNOSIS — I10 Essential (primary) hypertension: Secondary | ICD-10-CM | POA: Diagnosis not present

## 2014-11-04 DIAGNOSIS — S0101XA Laceration without foreign body of scalp, initial encounter: Secondary | ICD-10-CM | POA: Diagnosis not present

## 2014-11-04 DIAGNOSIS — W1789XA Other fall from one level to another, initial encounter: Secondary | ICD-10-CM | POA: Diagnosis not present

## 2014-11-04 DIAGNOSIS — Z79899 Other long term (current) drug therapy: Secondary | ICD-10-CM | POA: Diagnosis not present

## 2014-11-04 DIAGNOSIS — S0990XA Unspecified injury of head, initial encounter: Secondary | ICD-10-CM | POA: Diagnosis not present

## 2014-11-04 DIAGNOSIS — S0181XA Laceration without foreign body of other part of head, initial encounter: Secondary | ICD-10-CM | POA: Diagnosis not present

## 2014-11-04 DIAGNOSIS — M7989 Other specified soft tissue disorders: Secondary | ICD-10-CM | POA: Diagnosis not present

## 2014-11-04 DIAGNOSIS — S63283A Dislocation of proximal interphalangeal joint of left middle finger, initial encounter: Secondary | ICD-10-CM | POA: Diagnosis not present

## 2014-11-06 ENCOUNTER — Other Ambulatory Visit: Payer: Self-pay | Admitting: Family Medicine

## 2014-11-06 DIAGNOSIS — I10 Essential (primary) hypertension: Secondary | ICD-10-CM

## 2014-11-06 MED ORDER — LOSARTAN POTASSIUM 50 MG PO TABS: 50.0000 mg | ORAL_TABLET | Freq: Every day | ORAL | Status: DC

## 2014-11-12 ENCOUNTER — Ambulatory Visit (INDEPENDENT_AMBULATORY_CARE_PROVIDER_SITE_OTHER): Payer: Medicare Other

## 2014-11-12 ENCOUNTER — Ambulatory Visit (INDEPENDENT_AMBULATORY_CARE_PROVIDER_SITE_OTHER): Payer: Medicare Other | Admitting: Family Medicine

## 2014-11-12 ENCOUNTER — Encounter: Payer: Self-pay | Admitting: Family Medicine

## 2014-11-12 VITALS — BP 147/86 | HR 100 | Wt 160.0 lb

## 2014-11-12 DIAGNOSIS — M79645 Pain in left finger(s): Secondary | ICD-10-CM | POA: Diagnosis not present

## 2014-11-12 DIAGNOSIS — S0181XA Laceration without foreign body of other part of head, initial encounter: Secondary | ICD-10-CM | POA: Diagnosis not present

## 2014-11-12 DIAGNOSIS — W19XXXA Unspecified fall, initial encounter: Secondary | ICD-10-CM | POA: Diagnosis not present

## 2014-11-12 DIAGNOSIS — S6990XA Unspecified injury of unspecified wrist, hand and finger(s), initial encounter: Secondary | ICD-10-CM | POA: Insufficient documentation

## 2014-11-12 DIAGNOSIS — S6992XA Unspecified injury of left wrist, hand and finger(s), initial encounter: Secondary | ICD-10-CM

## 2014-11-12 DIAGNOSIS — M25442 Effusion, left hand: Secondary | ICD-10-CM

## 2014-11-12 DIAGNOSIS — M7989 Other specified soft tissue disorders: Secondary | ICD-10-CM | POA: Diagnosis not present

## 2014-11-12 NOTE — Patient Instructions (Signed)
Thank you for coming in today. Follow up with Dr. Ileene Rubens in a few weeks.  Keep the fingers taped for 2 weeks.   Scar Minimization You will have a scar anytime you have surgery and a cut is made in the skin or you have something removed from your skin (mole, skin cancer, cyst). Although scars are unavoidable following surgery, there are ways to minimize their appearance. It is important to follow all the instructions you receive from your caregiver about wound care. How your wound heals will influence the appearance of your scar. If you do not follow the wound care instructions as directed, complications such as infection may occur. Wound instructions include keeping the wound clean, moist, and not letting the wound form a scab. Some people form scars that are raised and lumpy (hypertrophic) or larger than the initial wound (keloidal). HOME CARE INSTRUCTIONS   Follow wound care instructions as directed.  Keep the wound clean by washing it with soap and water.  Keep the wound moist with provided antibiotic cream or petroleum jelly until completely healed. Moisten twice a day for about 2 weeks.  Get stitches (sutures) taken out at the scheduled time.  Avoid touching or manipulating your wound unless needed. Wash your hands thoroughly before and after touching your wound.  Follow all restrictions such as limits on exercise or work. This depends on where your scar is located.  Keep the scar protected from sunburn. Cover the scar with sunscreen/sunblock with SPF 30 or higher.  Gently massage the scar using a circular motion to help minimize the appearance of the scar. Do this only after the wound has closed and all the sutures have been removed.  For hypertrophic or keloidal scars, there are several ways to treat and minimize their appearance. Methods include compression therapy, intralesional corticosteroids, laser therapy, or surgery. These methods are performed by your caregiver. Remember that  the scar may appear lighter or darker than your normal skin color. This difference in color should even out with time. SEEK MEDICAL CARE IF:   You have a fever.  You develop signs of infection such as pain, redness, pus, and warmth.  You have questions or concerns. Document Released: 09/14/2009 Document Revised: 06/19/2011 Document Reviewed: 09/14/2009 [facility] Patient Information 2015 [place], [place]. This information is not intended to replace advice given to you by your health care provider. Make sure you discuss any questions you have with your health care provider.   Buddy Taping You have a minor finger or toe injury. It can be managed by buddy taping. Buddy taping means the injured finger or toe is taped to a healthy uninjured adjacent finger or toe. Most minor fractures and dislocations of the smaller fingers and toes will heal in 3 to 4 weeks. Buddy taping immobilizes and protects the area of injury. Buddy taping is not recommended for initial treatment of fractures of the thumb, longer fingers, or the great toe. Buddy taping should not be used for unstable or deformed fractures, but as fracture healing progresses it may be used for protection during rehabilitation. Fractured fingers and toes should be protected by buddy taping as long as the injury is still painful or swollen.  When an injury is buddy taped, place a small piece of gauze or cotton between the digits that are taped. This helps prevent the skin from breaking down from increased moisture. Buddy taping allows you to get your injury wet when you bathe. Change the gauze and tape more often if it gets  wet, and dry the space between the finger or toes. Use a sturdy, hard-soled shoe for better support if you have a fractured toe. In 2 to 3 weeks you can start motion exercises. This will keep the fingers or toes from becoming stiff.  SEEK IMMEDIATE MEDICAL CARE IF:   The injured area becomes cold, numb, or pale.  You have pain not  controlled with medications.  You notice increasing deformity of the toe or finger. Document Released: 05/04/2004 Document Revised: 06/19/2011 Document Reviewed: 09/02/2008 [facility] Patient Information 2015 [place], [place]. This information is not intended to replace advice given to you by your health care provider. Make sure you discuss any questions you have with your health care provider.

## 2014-11-13 DIAGNOSIS — S0181XA Laceration without foreign body of other part of head, initial encounter: Secondary | ICD-10-CM | POA: Insufficient documentation

## 2014-11-13 DIAGNOSIS — W19XXXA Unspecified fall, initial encounter: Secondary | ICD-10-CM | POA: Insufficient documentation

## 2014-11-13 NOTE — Assessment & Plan Note (Signed)
Obviously concerning. Unclear of the mechanism of fall. Patient thinks he tripped her daughters not on a percent sure. Regardless she is a relatively unscathed from this fall however it was particularly dangerous. Recommend she follow-up with her PCP in the near future for further evaluation and management of her fall risks.

## 2014-11-13 NOTE — Assessment & Plan Note (Signed)
Doing well. Buddy tape for 2 weeks

## 2014-11-13 NOTE — Progress Notes (Signed)
Danielle Mcdowell is a 81 y.o. female who presents to [facility]  today for suture removal hospital follow-up.  Patient tripped and fell at home on July 27. She was seen at [facility] emergency room the same day. At that time she had a CT scan which did not show bleed. She suffered a laceration to the right side of her forehead which was repaired with interrupted sutures. Additionally she suffered a dislocated left hand third digit PIP which was reduced. She currently feels well with no significant headache dizziness or personality change. She denies any palpitations or shortness of breath or chest pain preceding the fall. She notes that her finger feels well using a brace.  Of note the patient's daughter is concerned that she did not trip. The fall was unwitnessed. Patient denies any syncope.   Past Medical History  Diagnosis Date  . Osteoporosis 11/21/2013  . Type 2 diabetes mellitus 11/21/2013   No past surgical history on file. History  Substance Use Topics  . Smoking status: Never Smoker   . Smokeless tobacco: Not on file  . Alcohol Use: Not on file   ROS as above Medications: Current Outpatient Prescriptions  Medication Sig Dispense Refill  . azaTHIOprine (IMURAN) 50 MG tablet Take 1 tablet (50 mg total) by mouth daily. 90 tablet 1  . Cholecalciferol (VITAMIN D3) 2000 UNITS capsule Take 1 capsule (2,000 Units total) by mouth daily.    Marland Kitchen donepezil (ARICEPT) 5 MG tablet Take 1 tablet (5 mg total) by mouth at bedtime. 30 tablet 4  . losartan (COZAAR) 50 MG tablet Take 1 tablet (50 mg total) by mouth daily. Replaces 25mg  formulation. 30 tablet 0  . memantine (NAMENDA) 10 MG tablet Take 1 tablet (10 mg total) by mouth daily. Replaces the XR formulation that was expensive. 60 tablet 2  . pravastatin (PRAVACHOL) 40 MG tablet Take 1 tablet (40 mg total) by mouth daily. 90 tablet 3  . traZODone (DESYREL) 50 MG tablet Take 0.5-1 tablets (25-50 mg  total) by mouth at bedtime as needed for sleep. 30 tablet 3   No current facility-administered medications for this visit.   Allergies  Allergen Reactions  . Fosamax [Alendronate Sodium]   . Lisinopril      Exam:  BP 147/86 mmHg  Pulse 100  Wt 160 lb (72.576 kg) Gen: Well NAD HEENT: EOMI,  MMM Lungs: Normal work of breathing. CTABL Heart: RRR no MRG Abd: NABS, Soft. Nondistended, Nontender Exts: Brisk capillary refill, warm and well perfused.  Skin: Well-appearing curvilinear laceration right forehead with multiple interrupted sutures. No erythema or discharge or wound tenderness Left third digit PIP normal-appearing nontender no ecchymosis or swelling.  Interrupted sutures removed. No wound dehiscence  No results found for this or any previous visit (from the past 24 hour(s)). Dg Finger Middle Left  11/12/2014   CLINICAL DATA:  LEFT middle finger pain with bending for 1 week post PIP dislocation and reduction, fell down stairs 1 week ago  EXAM: LEFT MIDDLE FINGER 2+V  COMPARISON:  None  FINDINGS: Osseous demineralization.  Mild narrowing of DIP joint.  Mild diffuse soft tissue swelling LEFT middle finger.  No acute fracture, dislocation, or bone destruction.  IMPRESSION: Soft tissue swelling with mild degenerative changes at DIP joint.  No acute osseous abnormalities.   Electronically Signed   By: Lavonia Dana M.D.   On: 11/12/2014 17:36     Please see individual assessment and plan sections.

## 2014-11-13 NOTE — Assessment & Plan Note (Signed)
Healing well. Scar minimization reviewed with patient. Follow-up with PCP in a few weeks

## 2014-11-26 ENCOUNTER — Encounter: Payer: Self-pay | Admitting: Family Medicine

## 2014-11-26 ENCOUNTER — Ambulatory Visit (INDEPENDENT_AMBULATORY_CARE_PROVIDER_SITE_OTHER): Payer: Medicare Other | Admitting: Family Medicine

## 2014-11-26 VITALS — BP 144/87 | HR 69 | Wt 160.0 lb

## 2014-11-26 DIAGNOSIS — W19XXXA Unspecified fall, initial encounter: Secondary | ICD-10-CM | POA: Diagnosis not present

## 2014-11-26 DIAGNOSIS — S63253D Unspecified dislocation of left middle finger, subsequent encounter: Secondary | ICD-10-CM

## 2014-11-26 NOTE — Progress Notes (Signed)
CC: Danielle Mcdowell is a 81 y.o. female is here for 2 week f/u fall   Subjective: HPI:  Follow-up fall: Since she saw Dr. Georgina Mcdowell earlier this month she has not had any falls or close calls per the patient and her grandson. There was one episode of a fall just prior to the one earlier this month that required an emergency room visit. Other than that there have been no recent falls or close calls. The patient is going to be home alone for extended periods of time during the day starting next week. The patient has no complaints today and says that her hand and finger where there was a dislocation is no longer causing any pain and she has full function. She denies any pain at the site of her laceration of the forehead and has not had any discharge from the laceration site. She denies lightheadedness, irregular heartbeat or motor or sensory disturbances. She does not believe that she has an issue with balance.   Review Of Systems Outlined In HPI  Past Medical History  Diagnosis Date  . Osteoporosis 11/21/2013  . Type 2 diabetes mellitus 11/21/2013    No past surgical history on file. Family History  Problem Relation Age of Onset  . Diabetes      Social History   Social History  . Marital Status: Widowed    Spouse Name: N/A  . Number of Children: N/A  . Years of Education: N/A   Occupational History  . Not on file.   Social History Main Topics  . Smoking status: Never Smoker   . Smokeless tobacco: Not on file  . Alcohol Use: Not on file  . Drug Use: No  . Sexual Activity: No   Other Topics Concern  . Not on file   Social History Narrative     Objective: BP 144/87 mmHg  Pulse 69  Wt 160 lb (72.576 kg)  General: Alert and Oriented, No Acute Distress HEENT: Pupils equal, round, reactive to light. Conjunctivae clear.   Lungs: Clear to auscultation bilaterally, no wheezing/ronchi/rales.  Comfortable work of breathing. Good air movement. Cardiac: Regular rate and rhythm.  Normal S1/S2.  No murmurs, rubs, nor gallops.  No carotid bruit Extremities: No peripheral edema.  Strong peripheral pulses. Normal gait  Mental Status: No depression, anxiety, nor agitation. Skin: Warm and dry.  Assessment & Plan: Danielle Mcdowell was seen today for 2 week f/u fall.  Diagnoses and all orders for this visit:  Fall, initial encounter   Fall: I encouraged the patient to engage in home physical therapy to help with balance and to have home health come in for a fall evaluation. The patient politely declines this, I have let her and her grandson noted that these interventions is scientifically proven to help reduce the risk of fall at home. I have encouraged both the grandson and the patient to discuss this recommendation with the patient's daughter and if they change her mind call me and I will happily place referrals. I also directed them to the product "life alert" to be used at home.  Return if symptoms worsen or fail to improve.

## 2015-01-05 ENCOUNTER — Other Ambulatory Visit: Payer: Self-pay | Admitting: Family Medicine

## 2015-01-05 DIAGNOSIS — I10 Essential (primary) hypertension: Secondary | ICD-10-CM

## 2015-01-05 MED ORDER — LOSARTAN POTASSIUM 50 MG PO TABS: 50.0000 mg | ORAL_TABLET | Freq: Every day | ORAL | Status: DC

## 2015-03-31 ENCOUNTER — Encounter: Payer: Self-pay | Admitting: Family Medicine

## 2015-03-31 ENCOUNTER — Ambulatory Visit (INDEPENDENT_AMBULATORY_CARE_PROVIDER_SITE_OTHER): Payer: Medicare Other | Admitting: Family Medicine

## 2015-03-31 VITALS — BP 152/77 | HR 57 | Wt 160.0 lb

## 2015-03-31 DIAGNOSIS — G308 Other Alzheimer's disease: Secondary | ICD-10-CM | POA: Diagnosis not present

## 2015-03-31 DIAGNOSIS — I10 Essential (primary) hypertension: Secondary | ICD-10-CM | POA: Diagnosis not present

## 2015-03-31 DIAGNOSIS — R410 Disorientation, unspecified: Secondary | ICD-10-CM

## 2015-03-31 DIAGNOSIS — F0280 Dementia in other diseases classified elsewhere without behavioral disturbance: Secondary | ICD-10-CM | POA: Diagnosis not present

## 2015-03-31 DIAGNOSIS — Z23 Encounter for immunization: Secondary | ICD-10-CM

## 2015-03-31 DIAGNOSIS — K754 Autoimmune hepatitis: Secondary | ICD-10-CM

## 2015-03-31 LAB — HEPATIC FUNCTION PANEL
ALT: 7 U/L (ref 6–29)
AST: 13 U/L (ref 10–35)
Albumin: 3.9 g/dL (ref 3.6–5.1)
Alkaline Phosphatase: 77 U/L (ref 33–130)
BILIRUBIN INDIRECT: 0.4 mg/dL (ref 0.2–1.2)
Bilirubin, Direct: 0.1 mg/dL (ref <=0.2)
Total Bilirubin: 0.5 mg/dL (ref 0.2–1.2)
Total Protein: 7.3 g/dL (ref 6.1–8.1)

## 2015-03-31 MED ORDER — RIVASTIGMINE 4.6 MG/24HR TD PT24: 4.6000 mg | MEDICATED_PATCH | Freq: Every day | TRANSDERMAL | Status: DC

## 2015-03-31 MED ORDER — DONEPEZIL HCL 5 MG PO TABS: 5.0000 mg | ORAL_TABLET | Freq: Every day | ORAL | Status: DC

## 2015-03-31 MED ORDER — PRAVASTATIN SODIUM 40 MG PO TABS: 40.0000 mg | ORAL_TABLET | Freq: Every day | ORAL | Status: DC

## 2015-03-31 MED ORDER — LOSARTAN POTASSIUM 50 MG PO TABS: 50.0000 mg | ORAL_TABLET | Freq: Every day | ORAL | Status: DC

## 2015-03-31 MED ORDER — MEMANTINE HCL 10 MG PO TABS: 10.0000 mg | ORAL_TABLET | Freq: Every day | ORAL | Status: DC

## 2015-03-31 MED ORDER — AZATHIOPRINE 50 MG PO TABS: 50.0000 mg | ORAL_TABLET | Freq: Every day | ORAL | Status: DC

## 2015-03-31 NOTE — Progress Notes (Signed)
CC: Danielle Mcdowell is a 81 y.o. female is here for Memory Loss   Subjective: HPI:  Follow-up Alzheimer's: Danielle Mcdowell believes that this is worsening. There's been no wandering or behavior disturbance. Patient has been acting more forgetful with short-term memory. Compliance with donepezil and Namenda has been questioned by the Danielle Mcdowell. Danielle Mcdowell tried her best to help with medication management. Patient is still independent in all activities of daily living.  Follow-up essential hypertension: She's been taking losartan but it's thought to be she's not taking this with 100% compliance. Danielle Mcdowell is trying her best to help with medication management. Patient denies chest pain shortness of breath orthopnea or peripheral edema  Follow-up autoimmune hepatitis: Patient denies any right upper quadrant pain. She denies any decreased appetite or nausea.   Review Of Systems Outlined In HPI  Past Medical History  Diagnosis Date  . Osteoporosis 11/21/2013  . Type 2 diabetes mellitus (Danielle Mcdowell) 11/21/2013    No past surgical history on file. Family History  Problem Relation Age of Onset  . Diabetes      Social History   Social History  . Marital Status: Widowed    Spouse Name: N/A  . Number of Children: N/A  . Years of Education: N/A   Occupational History  . Not on file.   Social History Main Topics  . Smoking status: Never Smoker   . Smokeless tobacco: Not on file  . Alcohol Use: Not on file  . Drug Use: No  . Sexual Activity: No   Other Topics Concern  . Not on file   Social History Narrative     Objective: BP 152/77 mmHg  Pulse 57  Wt 160 lb (72.576 kg)  General: Alert and Oriented to place but not time., No Acute Distress HEENT: Pupils equal, round, reactive to light. Conjunctivae clear. Moist mucous membranes Lungs: Clear to auscultation bilaterally, no wheezing/ronchi/rales.  Comfortable work of breathing. Good air movement. Cardiac: Regular rate and rhythm. Normal S1/S2.   No murmurs, rubs, nor gallops.   Abdomen: soft nontender Extremities: No peripheral edema.  Strong peripheral pulses.  Mental Status: No depression, anxiety, nor agitation. Skin: Warm and dry.   Assessment & Plan: [name] was seen today for memory loss.  Diagnoses and all orders for this visit:  Alzheimer's disease of other onset -     rivastigmine (EXELON) 4.6 mg/24hr; Place 1 patch (4.6 mg total) onto the skin daily. To help with memory. -     Urinalysis, Routine w reflex microscopic -     Urine culture -     donepezil (ARICEPT) 5 MG tablet; Take 1 tablet (5 mg total) by mouth at bedtime. To help with memory. -     memantine (NAMENDA) 10 MG tablet; Take 1 tablet (10 mg total) by mouth daily. To help with memory. -     Ambulatory referral to Neurology  Essential hypertension, benign -     losartan (COZAAR) 50 MG tablet; Take 1 tablet (50 mg total) by mouth daily.  Autoimmune hepatitis ([place]) -     Hepatic function panel  Confusion -     Urine culture  Other orders -     azaTHIOprine (IMURAN) 50 MG tablet; Take 1 tablet (50 mg total) by mouth daily. To protect liver from autoimmune hepatitis. -     Discontinue: pravastatin (PRAVACHOL) 40 MG tablet; Take 1 tablet (40 mg total) by mouth daily. To help with memory. -     pravastatin (PRAVACHOL) 40 MG tablet; Take 1 tablet (  40 mg total) by mouth daily. To help with cholesterol and heart health.   Alzheimer's: Uncontrolled chronic condition adding Exelon continue Aricept and Namenda. Referral to neurologist per the request of her family, this seems reasonable.Rule out UTI causing family suspicion of increasing confusion Essential hypertension: Uncontrolled, stressed importance of 100% compliance to help with control of her blood pressure Autoimmune hepatitis: A symptomatic, checking liver function today, continue Imuran pending results   Return in about 4 weeks (around 04/28/2015) for Memory Follow Up.

## 2015-04-01 LAB — URINE CULTURE: Colony Count: 2000

## 2015-04-01 LAB — URINALYSIS, ROUTINE W REFLEX MICROSCOPIC
BILIRUBIN URINE: NEGATIVE
GLUCOSE, UA: NEGATIVE
HGB URINE DIPSTICK: NEGATIVE
Ketones, ur: NEGATIVE
NITRITE: NEGATIVE
PROTEIN: NEGATIVE
Specific Gravity, Urine: 1.023 (ref 1.001–1.035)
pH: 6 (ref 5.0–8.0)

## 2015-04-01 LAB — URINALYSIS, MICROSCOPIC ONLY
Bacteria, UA: NONE SEEN [HPF]
CASTS: NONE SEEN [LPF]
YEAST: NONE SEEN [HPF]

## 2015-04-10 ENCOUNTER — Other Ambulatory Visit: Payer: Self-pay | Admitting: Family Medicine

## 2015-05-01 ENCOUNTER — Other Ambulatory Visit: Payer: Self-pay | Admitting: Family Medicine

## 2015-07-23 DIAGNOSIS — S0990XA Unspecified injury of head, initial encounter: Secondary | ICD-10-CM | POA: Diagnosis not present

## 2015-07-23 DIAGNOSIS — S50311A Abrasion of right elbow, initial encounter: Secondary | ICD-10-CM | POA: Diagnosis not present

## 2015-07-23 DIAGNOSIS — I10 Essential (primary) hypertension: Secondary | ICD-10-CM | POA: Diagnosis not present

## 2015-07-23 DIAGNOSIS — R296 Repeated falls: Secondary | ICD-10-CM | POA: Diagnosis not present

## 2015-07-23 DIAGNOSIS — F0390 Unspecified dementia without behavioral disturbance: Secondary | ICD-10-CM | POA: Diagnosis not present

## 2015-07-23 DIAGNOSIS — S7001XA Contusion of right hip, initial encounter: Secondary | ICD-10-CM | POA: Diagnosis not present

## 2015-07-23 DIAGNOSIS — E785 Hyperlipidemia, unspecified: Secondary | ICD-10-CM | POA: Diagnosis not present

## 2015-07-23 DIAGNOSIS — W108XXA Fall (on) (from) other stairs and steps, initial encounter: Secondary | ICD-10-CM | POA: Diagnosis not present

## 2015-07-23 DIAGNOSIS — S59901A Unspecified injury of right elbow, initial encounter: Secondary | ICD-10-CM | POA: Diagnosis not present

## 2015-07-23 DIAGNOSIS — Z79899 Other long term (current) drug therapy: Secondary | ICD-10-CM | POA: Diagnosis not present

## 2015-10-14 ENCOUNTER — Ambulatory Visit: Payer: Medicare Other | Admitting: Family Medicine

## 2015-10-23 ENCOUNTER — Other Ambulatory Visit: Payer: Self-pay | Admitting: Family Medicine

## 2015-12-06 ENCOUNTER — Other Ambulatory Visit: Payer: Self-pay

## 2015-12-06 ENCOUNTER — Encounter: Payer: Self-pay | Admitting: Family Medicine

## 2015-12-06 ENCOUNTER — Ambulatory Visit (INDEPENDENT_AMBULATORY_CARE_PROVIDER_SITE_OTHER): Payer: Medicare Other | Admitting: Family Medicine

## 2015-12-06 VITALS — BP 150/85 | HR 66 | Wt 158.0 lb

## 2015-12-06 DIAGNOSIS — E1129 Type 2 diabetes mellitus with other diabetic kidney complication: Secondary | ICD-10-CM

## 2015-12-06 DIAGNOSIS — G308 Other Alzheimer's disease: Secondary | ICD-10-CM | POA: Diagnosis not present

## 2015-12-06 DIAGNOSIS — R296 Repeated falls: Secondary | ICD-10-CM | POA: Diagnosis not present

## 2015-12-06 DIAGNOSIS — K754 Autoimmune hepatitis: Secondary | ICD-10-CM | POA: Diagnosis not present

## 2015-12-06 DIAGNOSIS — Z23 Encounter for immunization: Secondary | ICD-10-CM

## 2015-12-06 DIAGNOSIS — F0280 Dementia in other diseases classified elsewhere without behavioral disturbance: Secondary | ICD-10-CM

## 2015-12-06 MED ORDER — AZATHIOPRINE 50 MG PO TABS: 50.0000 mg | ORAL_TABLET | Freq: Every day | ORAL | 1 refills | Status: DC

## 2015-12-06 MED ORDER — RIVASTIGMINE 4.6 MG/24HR TD PT24: 4.6000 mg | MEDICATED_PATCH | Freq: Every day | TRANSDERMAL | 12 refills | Status: DC

## 2015-12-06 MED ORDER — VALSARTAN 80 MG PO TABS: 80.0000 mg | ORAL_TABLET | Freq: Every day | ORAL | 1 refills | Status: DC

## 2015-12-06 NOTE — Progress Notes (Signed)
CC: Danielle Mcdowell is a 82 y.o. female is here for Hypertension and Medication Management   Subjective: HPI:  Accompanied by daughter  Patient is decided that she no longer wants to take any medications other than vitamin D. She is concerned that the medications were doing more harm than good. She noticed that she felt more dizzy while taking losartan but she can't seem to recall any other symptoms that made her feel that the medications were doing harm.  Follow-up autoimmune hepatitis: She denies any right upper quadrant pain or jaundice. Denies nausea or decreased appetite. She tells me she doesn't want to take Imuran, she cannot recall any specific side effects from this medication and has tolerated it for years.  Follow-up ulcers disease: At the last visit was recommended that she start on Exelon patches. She never ended up getting this prescription because she was afraid it might do more harm than good. She's never tried this medication before.  Follow-up type 2 diabetes: No outside blood sugars to report. Denies vision disturbance or poorly healing wounds. No polyuria  In the past 8 months she's fallen twice, once needing stitches after striking her head against a wall. She admits that she feels somewhat unstable with going from a warm to cool environment or vice versa. She denies any dizziness   Review Of Systems Outlined In HPI  Past Medical History:  Diagnosis Date  . Osteoporosis 11/21/2013  . Type 2 diabetes mellitus ([place]) 11/21/2013    No past surgical history on file. Family History  Problem Relation Age of Onset  . Diabetes      Social History   Social History  . Marital status: Widowed    Spouse name: N/A  . Number of children: N/A  . Years of education: N/A   Occupational History  . Not on file.   Social History Main Topics  . Smoking status: Never Smoker  . Smokeless tobacco: Not on file  . Alcohol use Not on file  . Drug use: No  . Sexual activity: No    Other Topics Concern  . Not on file   Social History Narrative  . No narrative on file     Objective: BP (!) 150/85   Pulse 66   Wt 158 lb (71.7 kg)   General: Alert and Oriented, No Acute Distress HEENT: Pupils equal, round, reactive to light. Conjunctivae clear.  Moist mucous membranes pharynx unremarkable Lungs: Clear to auscultation bilaterally, no wheezing/ronchi/rales.  Comfortable work of breathing. Good air movement. Cardiac: Regular rate and rhythm. Normal S1/S2.  No murmurs, rubs, nor gallops.   Extremities: No peripheral edema.  Strong peripheral pulses.  Mental Status: No depression, anxiety, nor agitation. Skin: Warm and dry.  Assessment & Plan: Danielle Mcdowell was seen today for hypertension and medication management.  Diagnoses and all orders for this visit:  Autoimmune hepatitis ([place]) -     COMPLETE METABOLIC PANEL WITH GFR  Alzheimer's disease of other onset -     rivastigmine (EXELON) 4.6 mg/24hr; Place 1 patch (4.6 mg total) onto the skin daily. To help with memory.  Controlled type 2 diabetes mellitus with other diabetic kidney complication, without long-term current use of insulin ([facility]) -     Hemoglobin A1c  Frequent falls  Needs flu shot -     Flu vaccine HIGH DOSE PF (Fluzone High dose)  Other orders -     azaTHIOprine (IMURAN) 50 MG tablet; Take 1 tablet (50 mg total) by mouth daily. To protect liver  from autoimmune hepatitis. -     valsartan (DIOVAN) 80 MG tablet; Take 1 tablet (80 mg total) by mouth daily.   Autoimmune hepatitis: Asymptomatic, checking liver function above. I was able to talk her into starting back on Imuran after I was able to indicate to her that this is preventing her immune system from attacking her liver. Alzheimer's disease: I discussed how Exelon works to help preserve memory, she is open to the idea of trying this now. I also recommended that she start going to the [facility] in [place] for some  socialization which could also help preserve some of her memory. Essential hypertension: She is open to the idea of switching to valsartan instead of losartan. Due for A1c for diabetes check.  Discussed with this patient that I will be resigning from my position here with [facility] [employer] in September in order to stay with my family who will be moving to [facility]. I let him know about the providers that are still accepting patients and I feel that this individual will be under great care if he/she stays here with [facility].   Return in about 3 months (around 03/07/2016) for First visit with Dr. Monika Salk or [facility]

## 2015-12-07 LAB — COMPLETE METABOLIC PANEL WITH GFR
ALBUMIN: 4.3 g/dL (ref 3.6–5.1)
ALK PHOS: 79 U/L (ref 33–130)
ALT: 6 U/L (ref 6–29)
AST: 12 U/L (ref 10–35)
BILIRUBIN TOTAL: 0.4 mg/dL (ref 0.2–1.2)
BUN: 13 mg/dL (ref 7–25)
CALCIUM: 9.9 mg/dL (ref 8.6–10.4)
CO2: 25 mmol/L (ref 20–31)
Chloride: 103 mmol/L (ref 98–110)
Creat: 0.88 mg/dL (ref 0.60–0.88)
GFR, EST AFRICAN AMERICAN: 71 mL/min (ref >=60)
GFR, EST NON AFRICAN AMERICAN: 61 mL/min (ref >=60)
GLUCOSE: 105 mg/dL — AB (ref 65–99)
Potassium: 3.8 mmol/L (ref 3.5–5.3)
Sodium: 139 mmol/L (ref 135–146)
Total Protein: 7.4 g/dL (ref 6.1–8.1)

## 2015-12-07 LAB — HEMOGLOBIN A1C
Hgb A1c MFr Bld: 6 % — ABNORMAL HIGH (ref <5.7)
Mean Plasma Glucose: 126 mg/dL

## 2015-12-08 DIAGNOSIS — R296 Repeated falls: Secondary | ICD-10-CM | POA: Diagnosis not present

## 2015-12-08 DIAGNOSIS — Z79899 Other long term (current) drug therapy: Secondary | ICD-10-CM | POA: Diagnosis not present

## 2015-12-08 DIAGNOSIS — G309 Alzheimer's disease, unspecified: Secondary | ICD-10-CM | POA: Diagnosis not present

## 2015-12-08 DIAGNOSIS — F0281 Dementia in other diseases classified elsewhere with behavioral disturbance: Secondary | ICD-10-CM | POA: Diagnosis not present

## 2015-12-08 DIAGNOSIS — E1129 Type 2 diabetes mellitus with other diabetic kidney complication: Secondary | ICD-10-CM | POA: Diagnosis not present

## 2015-12-08 DIAGNOSIS — K754 Autoimmune hepatitis: Secondary | ICD-10-CM | POA: Diagnosis not present

## 2015-12-08 DIAGNOSIS — R2689 Other abnormalities of gait and mobility: Secondary | ICD-10-CM | POA: Diagnosis not present

## 2015-12-15 DIAGNOSIS — R2689 Other abnormalities of gait and mobility: Secondary | ICD-10-CM | POA: Diagnosis not present

## 2015-12-15 DIAGNOSIS — E1129 Type 2 diabetes mellitus with other diabetic kidney complication: Secondary | ICD-10-CM | POA: Diagnosis not present

## 2015-12-15 DIAGNOSIS — F0281 Dementia in other diseases classified elsewhere with behavioral disturbance: Secondary | ICD-10-CM | POA: Diagnosis not present

## 2015-12-15 DIAGNOSIS — K754 Autoimmune hepatitis: Secondary | ICD-10-CM | POA: Diagnosis not present

## 2015-12-15 DIAGNOSIS — G309 Alzheimer's disease, unspecified: Secondary | ICD-10-CM | POA: Diagnosis not present

## 2015-12-15 DIAGNOSIS — R296 Repeated falls: Secondary | ICD-10-CM | POA: Diagnosis not present

## 2015-12-22 DIAGNOSIS — F0281 Dementia in other diseases classified elsewhere with behavioral disturbance: Secondary | ICD-10-CM | POA: Diagnosis not present

## 2015-12-22 DIAGNOSIS — E1129 Type 2 diabetes mellitus with other diabetic kidney complication: Secondary | ICD-10-CM | POA: Diagnosis not present

## 2015-12-22 DIAGNOSIS — R2689 Other abnormalities of gait and mobility: Secondary | ICD-10-CM | POA: Diagnosis not present

## 2015-12-22 DIAGNOSIS — R296 Repeated falls: Secondary | ICD-10-CM | POA: Diagnosis not present

## 2015-12-22 DIAGNOSIS — K754 Autoimmune hepatitis: Secondary | ICD-10-CM | POA: Diagnosis not present

## 2015-12-22 DIAGNOSIS — G309 Alzheimer's disease, unspecified: Secondary | ICD-10-CM | POA: Diagnosis not present

## 2015-12-24 DIAGNOSIS — E1129 Type 2 diabetes mellitus with other diabetic kidney complication: Secondary | ICD-10-CM | POA: Diagnosis not present

## 2015-12-24 DIAGNOSIS — K754 Autoimmune hepatitis: Secondary | ICD-10-CM | POA: Diagnosis not present

## 2015-12-24 DIAGNOSIS — R296 Repeated falls: Secondary | ICD-10-CM | POA: Diagnosis not present

## 2015-12-24 DIAGNOSIS — F0281 Dementia in other diseases classified elsewhere with behavioral disturbance: Secondary | ICD-10-CM | POA: Diagnosis not present

## 2015-12-24 DIAGNOSIS — G309 Alzheimer's disease, unspecified: Secondary | ICD-10-CM | POA: Diagnosis not present

## 2015-12-24 DIAGNOSIS — R2689 Other abnormalities of gait and mobility: Secondary | ICD-10-CM | POA: Diagnosis not present

## 2015-12-28 DIAGNOSIS — K754 Autoimmune hepatitis: Secondary | ICD-10-CM | POA: Diagnosis not present

## 2015-12-28 DIAGNOSIS — G309 Alzheimer's disease, unspecified: Secondary | ICD-10-CM | POA: Diagnosis not present

## 2015-12-28 DIAGNOSIS — E1129 Type 2 diabetes mellitus with other diabetic kidney complication: Secondary | ICD-10-CM | POA: Diagnosis not present

## 2015-12-28 DIAGNOSIS — F0281 Dementia in other diseases classified elsewhere with behavioral disturbance: Secondary | ICD-10-CM | POA: Diagnosis not present

## 2015-12-28 DIAGNOSIS — R2689 Other abnormalities of gait and mobility: Secondary | ICD-10-CM | POA: Diagnosis not present

## 2015-12-28 DIAGNOSIS — R296 Repeated falls: Secondary | ICD-10-CM | POA: Diagnosis not present

## 2015-12-29 DIAGNOSIS — R2689 Other abnormalities of gait and mobility: Secondary | ICD-10-CM | POA: Diagnosis not present

## 2015-12-29 DIAGNOSIS — R296 Repeated falls: Secondary | ICD-10-CM | POA: Diagnosis not present

## 2015-12-29 DIAGNOSIS — K754 Autoimmune hepatitis: Secondary | ICD-10-CM | POA: Diagnosis not present

## 2015-12-29 DIAGNOSIS — F0281 Dementia in other diseases classified elsewhere with behavioral disturbance: Secondary | ICD-10-CM | POA: Diagnosis not present

## 2015-12-29 DIAGNOSIS — E1129 Type 2 diabetes mellitus with other diabetic kidney complication: Secondary | ICD-10-CM | POA: Diagnosis not present

## 2015-12-29 DIAGNOSIS — G309 Alzheimer's disease, unspecified: Secondary | ICD-10-CM | POA: Diagnosis not present

## 2016-01-03 DIAGNOSIS — K754 Autoimmune hepatitis: Secondary | ICD-10-CM | POA: Diagnosis not present

## 2016-01-03 DIAGNOSIS — R2689 Other abnormalities of gait and mobility: Secondary | ICD-10-CM | POA: Diagnosis not present

## 2016-01-03 DIAGNOSIS — R296 Repeated falls: Secondary | ICD-10-CM | POA: Diagnosis not present

## 2016-01-03 DIAGNOSIS — E1129 Type 2 diabetes mellitus with other diabetic kidney complication: Secondary | ICD-10-CM | POA: Diagnosis not present

## 2016-01-03 DIAGNOSIS — G309 Alzheimer's disease, unspecified: Secondary | ICD-10-CM | POA: Diagnosis not present

## 2016-01-03 DIAGNOSIS — F0281 Dementia in other diseases classified elsewhere with behavioral disturbance: Secondary | ICD-10-CM | POA: Diagnosis not present

## 2016-01-06 DIAGNOSIS — R2689 Other abnormalities of gait and mobility: Secondary | ICD-10-CM | POA: Diagnosis not present

## 2016-01-06 DIAGNOSIS — E1129 Type 2 diabetes mellitus with other diabetic kidney complication: Secondary | ICD-10-CM | POA: Diagnosis not present

## 2016-01-06 DIAGNOSIS — K754 Autoimmune hepatitis: Secondary | ICD-10-CM | POA: Diagnosis not present

## 2016-01-06 DIAGNOSIS — F0281 Dementia in other diseases classified elsewhere with behavioral disturbance: Secondary | ICD-10-CM | POA: Diagnosis not present

## 2016-01-06 DIAGNOSIS — G309 Alzheimer's disease, unspecified: Secondary | ICD-10-CM | POA: Diagnosis not present

## 2016-01-06 DIAGNOSIS — R296 Repeated falls: Secondary | ICD-10-CM | POA: Diagnosis not present

## 2016-03-07 ENCOUNTER — Ambulatory Visit (INDEPENDENT_AMBULATORY_CARE_PROVIDER_SITE_OTHER): Payer: Medicare Other | Admitting: Osteopathic Medicine

## 2016-03-07 ENCOUNTER — Encounter: Payer: Self-pay | Admitting: Osteopathic Medicine

## 2016-03-07 VITALS — BP 153/94 | HR 72 | Ht 61.0 in | Wt 159.0 lb

## 2016-03-07 DIAGNOSIS — F0280 Dementia in other diseases classified elsewhere without behavioral disturbance: Secondary | ICD-10-CM | POA: Diagnosis not present

## 2016-03-07 DIAGNOSIS — G308 Other Alzheimer's disease: Secondary | ICD-10-CM | POA: Diagnosis not present

## 2016-03-07 DIAGNOSIS — I10 Essential (primary) hypertension: Secondary | ICD-10-CM

## 2016-03-07 DIAGNOSIS — E1129 Type 2 diabetes mellitus with other diabetic kidney complication: Secondary | ICD-10-CM | POA: Diagnosis not present

## 2016-03-07 DIAGNOSIS — R296 Repeated falls: Secondary | ICD-10-CM | POA: Diagnosis not present

## 2016-03-07 DIAGNOSIS — I499 Cardiac arrhythmia, unspecified: Secondary | ICD-10-CM

## 2016-03-07 DIAGNOSIS — K754 Autoimmune hepatitis: Secondary | ICD-10-CM | POA: Diagnosis not present

## 2016-03-07 NOTE — Progress Notes (Signed)
HPI: Danielle Mcdowell is a 82 y.o. female  who presents to [facility] today, 03/07/16,  for chief complaint of:  Chief Complaint  Patient presents with  . Establish Care    SWITCH FROM HOMMEL FOLLOW UP FOR MEMORY     Memory problem/dementia: Daughter reports that referral was canceled, and looked in Epic, they had made 3 times to contact patient without success. Memory has still been a problem though no significant severe change. No behavioral concerns such as violence, wandering, agitation.  Autoimmune hepatitis: Patient's daughter states that she is not very good about taking her medication. Review of most recent labs shows normal liver enzymes.  Frequent falls: Records reviewed, history of falling, ambulatory dysfunction. Patient does not use walking aid.   Hypertension: Patient cannot recall if she took medications as usual. Daughter suspects that she did not.  Irregular heartbeat: Noted on exam, no history of atrial fibrillation. Patient denies chest pain, pressure, shortness of breath, dizziness.  Patient is accompanied by Sena Hitch who assists with history-taking.   Past medical, surgical, social and family history reviewed: Patient Active Problem List   Diagnosis Date Noted  . Forehead laceration 11/13/2014  . Fall 11/13/2014  . Finger injury 11/12/2014  . Hyperlipidemia 11/25/2013  . Adenomatous polyp of colon 11/25/2013  . Type 2 diabetes mellitus with renal manifestations, controlled ([place]) 11/24/2013  . Autoimmune hepatitis ([place]) 11/21/2013  . Encounter for long term current azathioprine therapy 11/21/2013  . Alzheimer's disease 11/21/2013  . Diabetic nephropathy associated with type 2 diabetes mellitus ([place]) 11/21/2013  . Essential hypertension, benign 11/21/2013  . Insomnia 11/21/2013  . Osteoporosis 11/21/2013   No past surgical history on file. Social History  Substance Use Topics  . Smoking status: Never Smoker  .  Smokeless tobacco: Not on file  . Alcohol use Not on file   Family History  Problem Relation Age of Onset  . Diabetes       Current medication list and allergy/intolerance information reviewed:   Current Outpatient Prescriptions on File Prior to Visit  Medication Sig Dispense Refill  . azaTHIOprine (IMURAN) 50 MG tablet Take 1 tablet (50 mg total) by mouth daily. To protect liver from autoimmune hepatitis. 90 tablet 1  . Cholecalciferol (VITAMIN D3) 2000 UNITS capsule Take 1 capsule (2,000 Units total) by mouth daily.    . rivastigmine (EXELON) 4.6 mg/24hr Place 1 patch (4.6 mg total) onto the skin daily. To help with memory. 30 patch 12  . valsartan (DIOVAN) 80 MG tablet Take 1 tablet (80 mg total) by mouth daily. 90 tablet 1   No current facility-administered medications on file prior to visit.    Allergies  Allergen Reactions  . Fosamax [Alendronate Sodium]   . Lisinopril       Review of Systems:  Constitutional: No recent illness  HEENT: No  headache, no vision change  Cardiac: No  chest pain, No  pressure, No palpitations  Respiratory:  No  shortness of breath. No  Cough  Gastrointestinal: No  abdominal pain, no change on bowel habits  Musculoskeletal: No new myalgia/arthralgia  Skin: No  Rash  Neurologic: No  weakness, No  Dizziness   Exam:  BP (!) 153/94   Pulse 72   Ht 5\' 1"  (1.549 m)   Wt 159 lb (72.1 kg)   BMI 30.04 kg/m   Constitutional: VS see above. General Appearance: alert, well-developed, well-nourished, NAD  Eyes: Normal lids and conjunctive, non-icteric sclera  Ears, Nose, Mouth,  Throat: MMM, Normal external inspection ears/nares/mouth/lips/gums.  Neck: No masses, trachea midline.   Respiratory: Normal respiratory effort. no wheeze, no rhonchi, no rales  Cardiovascular: S1/S2 normal, no murmur, no rub/gallop auscultated. Irregular rhythm,   Musculoskeletal: Gait normal. Symmetric and independent movement of all  extremities  Neurological: Normal balance/coordination. No tremor.  Skin: warm, dry, intact.   Psychiatric: Normal judgment/insight. Normal mood and affect. Oriented x3.   EKG interpretation: Rate: 71 Rhythm: sinus though P waves difficult to discern No ST/T changes concerning for acute ischemia/infarct      ASSESSMENT/PLAN:   Irregular heartbeat - EKG shows sinus rhythm with PAC. P waves are difficult to discern but they are present, rhythm is relatively regular. No fibrillation. - Plan: EKG, COMPLETE METABOLIC PANEL WITH GFR, Hemoglobin A1c, TSH  Autoimmune hepatitis ([place]) - Medication compliance stressed to the patient - Plan: COMPLETE METABOLIC PANEL WITH GFR, CBC with Differential/Platelet, TSH  Controlled type 2 diabetes mellitus with other diabetic kidney complication, without long-term current use of insulin ([place]) - Plan: Hemoglobin A1c  Frequent falls - Precautions reviewed  Alzheimer's disease of other onset without behavioral disturbance - We will again place neurology referral - Plan: Ambulatory referral to Neurology, CBC with Differential/Platelet, TSH  Essential hypertension, benign - Medication compliance stressed to the patient      Visit summary with medication list and pertinent instructions was printed for patient to review. All questions at time of visit were answered - patient instructed to contact office with any additional concerns. ER/RTC precautions were reviewed with the patient. Follow-up plan: Return in about 6 months (around 09/04/2016) for [place] sooner if needed.  Note: Total time spent 25 minutes, greater than 50% of the visit was spent face-to-face counseling and coordinating care for the following: The primary encounter diagnosis was Irregular heartbeat. Diagnoses of Autoimmune hepatitis ([place]), Controlled type 2 diabetes mellitus with other diabetic kidney complication, without long-term current use of insulin ([place]), Frequent falls,  Alzheimer's disease of other onset without behavioral disturbance, and Essential hypertension, benign were also pertinent to this visit.Marland Kitchen

## 2016-03-08 LAB — CBC WITH DIFFERENTIAL/PLATELET
BASOS PCT: 0 %
Basophils Absolute: 0 cells/uL (ref 0–200)
Eosinophils Absolute: 219 cells/uL (ref 15–500)
Eosinophils Relative: 3 %
HEMATOCRIT: 44.8 % (ref 35.0–45.0)
Hemoglobin: 14.7 g/dL (ref 11.7–15.5)
LYMPHS PCT: 26 %
Lymphs Abs: 1898 cells/uL (ref 850–3900)
MCH: 29.8 pg (ref 27.0–33.0)
MCHC: 32.8 g/dL (ref 32.0–36.0)
MCV: 90.9 fL (ref 80.0–100.0)
MONOS PCT: 8 %
MPV: 9.9 fL (ref 7.5–12.5)
Monocytes Absolute: 584 cells/uL (ref 200–950)
NEUTROS PCT: 63 %
Neutro Abs: 4599 cells/uL (ref 1500–7800)
PLATELETS: 237 10*3/uL (ref 140–400)
RBC: 4.93 MIL/uL (ref 3.80–5.10)
RDW: 13.3 % (ref 11.0–15.0)
WBC: 7.3 10*3/uL (ref 3.8–10.8)

## 2016-03-08 LAB — COMPLETE METABOLIC PANEL WITH GFR
ALBUMIN: 4.1 g/dL (ref 3.6–5.1)
ALK PHOS: 100 U/L (ref 33–130)
ALT: 7 U/L (ref 6–29)
AST: 13 U/L (ref 10–35)
BILIRUBIN TOTAL: 0.4 mg/dL (ref 0.2–1.2)
BUN: 17 mg/dL (ref 7–25)
CO2: 25 mmol/L (ref 20–31)
Calcium: 9.8 mg/dL (ref 8.6–10.4)
Chloride: 103 mmol/L (ref 98–110)
Creat: 0.99 mg/dL — ABNORMAL HIGH (ref 0.60–0.88)
GFR, EST AFRICAN AMERICAN: 61 mL/min (ref >=60)
GFR, EST NON AFRICAN AMERICAN: 53 mL/min — AB (ref >=60)
GLUCOSE: 110 mg/dL — AB (ref 65–99)
POTASSIUM: 3.9 mmol/L (ref 3.5–5.3)
SODIUM: 138 mmol/L (ref 135–146)
TOTAL PROTEIN: 7.6 g/dL (ref 6.1–8.1)

## 2016-03-08 LAB — HEMOGLOBIN A1C
HEMOGLOBIN A1C: 6.2 % — AB (ref <5.7)
MEAN PLASMA GLUCOSE: 131 mg/dL

## 2016-03-08 LAB — TSH: TSH: 1.24 mIU/L

## 2016-03-09 NOTE — Addendum Note (Signed)
Addended by: Huel Cote on: 03/09/2016 03:56 PM   Modules accepted: Orders

## 2016-04-13 ENCOUNTER — Ambulatory Visit: Payer: Self-pay | Admitting: Neurology

## 2016-04-18 ENCOUNTER — Encounter: Payer: Self-pay | Admitting: Neurology

## 2016-04-28 ENCOUNTER — Encounter: Payer: Self-pay | Admitting: Emergency Medicine

## 2016-04-28 ENCOUNTER — Emergency Department (INDEPENDENT_AMBULATORY_CARE_PROVIDER_SITE_OTHER)
Admission: EM | Admit: 2016-04-28 | Discharge: 2016-04-28 | Disposition: A | Discharge status: Home / Self Care | Payer: Medicare Other | Source: Home / Self Care | Attending: Family Medicine | Admitting: Family Medicine

## 2016-04-28 DIAGNOSIS — B9789 Other viral agents as the cause of diseases classified elsewhere: Secondary | ICD-10-CM

## 2016-04-28 DIAGNOSIS — J069 Acute upper respiratory infection, unspecified: Secondary | ICD-10-CM | POA: Diagnosis not present

## 2016-04-28 MED ORDER — AMOXICILLIN 875 MG PO TABS: 875.0000 mg | ORAL_TABLET | Freq: Two times a day (BID) | ORAL | 0 refills | Status: DC

## 2016-04-28 NOTE — ED Provider Notes (Signed)
[facility]    CSN: [id number] Arrival date & time: 04/28/16  1843     History   Chief Complaint Chief Complaint  Patient presents with  . Cough  . Nasal Congestion    HPI Danielle Mcdowell is a 83 y.o. female.   Patient has developed cough and nasal congestion during the past two days, but has not been otherwise ill.  No fever.  Several other family members have URI's.   The history is provided by the patient and a relative.    Past Medical History:  Diagnosis Date  . Osteoporosis 11/21/2013  . Type 2 diabetes mellitus ([place]) 11/21/2013    Patient Active Problem List   Diagnosis Date Noted  . Forehead laceration 11/13/2014  . Fall 11/13/2014  . Finger injury 11/12/2014  . Hyperlipidemia 11/25/2013  . Adenomatous polyp of colon 11/25/2013  . Type 2 diabetes mellitus with renal manifestations, controlled ([place]) 11/24/2013  . Autoimmune hepatitis ([place]) 11/21/2013  . Encounter for long term current azathioprine therapy 11/21/2013  . Alzheimer's disease 11/21/2013  . Diabetic nephropathy associated with type 2 diabetes mellitus ([place]) 11/21/2013  . Essential hypertension, benign 11/21/2013  . Insomnia 11/21/2013  . Osteoporosis 11/21/2013    History reviewed. No pertinent surgical history.  OB History    No data available       Home Medications    Prior to Admission medications   Medication Sig Start Date End Date Taking? Authorizing Provider  amoxicillin (AMOXIL) 875 MG tablet Take 1 tablet (875 mg total) by mouth 2 (two) times daily. (Rx void after 05/06/16) 04/28/16   Kandra Nicolas, MD  azaTHIOprine (IMURAN) 50 MG tablet Take 1 tablet (50 mg total) by mouth daily. To protect liver from autoimmune hepatitis. 12/06/15   Marcial Pacas, DO  Cholecalciferol (VITAMIN D3) 2000 UNITS capsule Take 1 capsule (2,000 Units total) by mouth daily. 11/21/13   Marcial Pacas, DO  rivastigmine (EXELON) 4.6 mg/24hr Place 1 patch (4.6 mg total) onto the skin daily. To help  with memory. 12/06/15   Sean Hommel, DO  valsartan (DIOVAN) 80 MG tablet Take 1 tablet (80 mg total) by mouth daily. 12/06/15   Marcial Pacas, DO    Family History Family History  Problem Relation Age of Onset  . Diabetes      Social History Social History  Substance Use Topics  . Smoking status: Never Smoker  . Smokeless tobacco: Never Used  . Alcohol use Not on file     Allergies   Fosamax [alendronate sodium] and Lisinopril   Review of Systems Review of Systems No sore throat + cough No pleuritic pain No wheezing + nasal congestion + post-nasal drainage No sinus pain/pressure No itchy/red eyes No earache No hemoptysis No SOB No fever/chills No nausea No vomiting No abdominal pain No diarrhea No urinary symptoms No skin rash No fatigue No myalgias No headache    Physical Exam Triage Vital Signs ED Triage Vitals  Enc Vitals Group     BP 04/28/16 1925 114/74     Pulse Rate 04/28/16 1925 84     Resp 04/28/16 1925 16     Temp 04/28/16 1925 97.9 F (36.6 C)     Temp Source 04/28/16 1925 Oral     SpO2 04/28/16 1925 97 %     Weight 04/28/16 1926 159 lb (72.1 kg)     Height 04/28/16 1926 5\' 1"  (1.549 m)     Head Circumference --      Peak Flow --  Pain Score 04/28/16 1928 0     Pain Loc --      Pain Edu? --      Excl. in [place]? --    No data found.   Updated Vital Signs BP 114/74 (BP Location: Left Arm)   Pulse 84   Temp 97.9 F (36.6 C) (Oral)   Resp 16   Ht 5\' 1"  (1.549 m)   Wt 159 lb (72.1 kg)   SpO2 97%   BMI 30.04 kg/m   Visual Acuity Right Eye Distance:   Left Eye Distance:   Bilateral Distance:    Right Eye Near:   Left Eye Near:    Bilateral Near:     Physical Exam Nursing notes and Vital Signs reviewed. Appearance:  Patient appears stated age, and in no acute distress.  She is alert. Eyes:  Pupils are equal, round, and reactive to light and accomodation.  Extraocular movement is intact.  Conjunctivae are not inflamed    Ears:  Canals normal.  Tympanic membranes normal.  Nose:  Mildly congested turbinates.  No sinus tenderness.   Pharynx:  Normal Neck:  Supple.  Tender enlarged posterior/lateral nodes are palpated bilaterally  Lungs:  Clear to auscultation.  Breath sounds are equal.  Moving air well. Heart:   Slightly irregular rhythm, without murmurs, rubs, or gallops.  Abdomen:  Nontender without masses or hepatosplenomegaly.  Bowel sounds are present.  No CVA or flank tenderness.  Extremities:  No edema.  Skin:  No rash present.    UC Treatments / Results  Labs (all labs ordered are listed, but only abnormal results are displayed) Labs Reviewed - No data to display  EKG  EKG Interpretation None       Radiology No results found.  Procedures Procedures (including critical care time)  Medications Ordered in UC Medications - No data to display   Initial Impression / Assessment and Plan / UC Course  I have reviewed the triage vital signs and the nursing notes.  Pertinent labs & imaging results that were available during my care of the patient were reviewed by me and considered in my medical decision making (see chart for details).  There is no evidence of bacterial infection today.  Treat symptomatically for now  Take plain guaifenesin (1200mg  extended release tabs such as Mucinex) twice daily, with plenty of water, for cough and congestion. Get adequate rest.   Also recommend using saline nasal spray several times daily and saline nasal irrigation (AYR is a common brand).   Try warm salt water gargles for sore throat.  Stop all antihistamines for now, and other non-prescription cough/cold preparations. May take Delsym Cough Suppressant at bedtime for nighttime cough.  Begin Amoxicillin if not improving about one week or if persistent fever develops (Given a prescription to hold, with an expiration date)  Follow-up with family doctor if not improving about10 days.      Final Clinical  Impressions(s) / UC Diagnoses   Final diagnoses:  Viral URI with cough    New Prescriptions New Prescriptions   AMOXICILLIN (AMOXIL) 875 MG TABLET    Take 1 tablet (875 mg total) by mouth 2 (two) times daily. (Rx void after 05/06/16)     Kandra Nicolas, MD 05/06/16 1536

## 2016-04-28 NOTE — ED Triage Notes (Signed)
Patient reports nasal drainage and cough for past 2 days; other members of family she lives with have URIs; she denies fever; smiling and in no apparent distress.

## 2016-04-28 NOTE — Discharge Instructions (Signed)
Take plain guaifenesin (1200mg  extended release tabs such as Mucinex) twice daily, with plenty of water, for cough and congestion. Get adequate rest.   Also recommend using saline nasal spray several times daily and saline nasal irrigation (AYR is a common brand).   Try warm salt water gargles for sore throat.  Stop all antihistamines for now, and other non-prescription cough/cold preparations. May take Delsym Cough Suppressant at bedtime for nighttime cough.  Begin Amoxicillin if not improving about one week or if persistent fever develops   Follow-up with family doctor if not improving about10 days.

## 2016-04-30 ENCOUNTER — Telehealth: Payer: Self-pay | Admitting: Emergency Medicine

## 2016-04-30 NOTE — Telephone Encounter (Signed)
Inquired about patient's status; encourage them to call with questions/concerns.  

## 2016-09-11 ENCOUNTER — Ambulatory Visit (INDEPENDENT_AMBULATORY_CARE_PROVIDER_SITE_OTHER): Payer: Medicare Other | Admitting: Osteopathic Medicine

## 2016-09-11 ENCOUNTER — Encounter: Payer: Self-pay | Admitting: Osteopathic Medicine

## 2016-09-11 VITALS — BP 177/91 | HR 77 | Ht 61.0 in | Wt 165.0 lb

## 2016-09-11 DIAGNOSIS — M810 Age-related osteoporosis without current pathological fracture: Secondary | ICD-10-CM

## 2016-09-11 DIAGNOSIS — Z23 Encounter for immunization: Secondary | ICD-10-CM

## 2016-09-11 DIAGNOSIS — Z0000 Encounter for general adult medical examination without abnormal findings: Secondary | ICD-10-CM | POA: Diagnosis not present

## 2016-09-11 DIAGNOSIS — Z9181 History of falling: Secondary | ICD-10-CM

## 2016-09-11 DIAGNOSIS — E119 Type 2 diabetes mellitus without complications: Secondary | ICD-10-CM

## 2016-09-11 DIAGNOSIS — I10 Essential (primary) hypertension: Secondary | ICD-10-CM | POA: Diagnosis not present

## 2016-09-11 LAB — POCT GLYCOSYLATED HEMOGLOBIN (HGB A1C): Hemoglobin A1C: 6.5

## 2016-09-11 LAB — CBC
HEMATOCRIT: 44.9 % (ref 35.0–45.0)
Hemoglobin: 14.9 g/dL (ref 11.7–15.5)
MCH: 30.4 pg (ref 27.0–33.0)
MCHC: 33.2 g/dL (ref 32.0–36.0)
MCV: 91.6 fL (ref 80.0–100.0)
MPV: 10.1 fL (ref 7.5–12.5)
PLATELETS: 205 10*3/uL (ref 140–400)
RBC: 4.9 MIL/uL (ref 3.80–5.10)
RDW: 13.7 % (ref 11.0–15.0)
WBC: 9.3 10*3/uL (ref 3.8–10.8)

## 2016-09-11 MED ORDER — VALSARTAN 40 MG PO TABS: 40.0000 mg | ORAL_TABLET | Freq: Every day | ORAL | 1 refills | Status: DC

## 2016-09-11 NOTE — Patient Instructions (Addendum)
Thanks for coming in today! Plan:  Restart blood pressure medications to reduce risk of heart attack and stroke  Let me know when due for refill on the patches  We are getting labs today for medication safety   I encourage patients to give [place] a copy of any Advanced Directives (Living Will, etc) to have in their charts in case of serious illness, I'm happy to answer any questions you may have about these kinds of forms so feel free to bring them with you to your next visit.     Please note: Preventive care issues were addressed today per annual physical requirements and should be covered under your insurance, however there were other medical issues which were also addressed and insurance may bill you separately for "problem-based visit." Any questions or concerns about charges which may appear on your bill should be directed to your insurance company or to [facility] billing department, please contact our office with any other questions.

## 2016-09-11 NOTE — Progress Notes (Signed)
HPI: Danielle Mcdowell is a 83 y.o. female  who presents to [place] today, 09/11/16,  for Medicare Annual Wellness Exam  Patient presents for annual physical/Medicare wellness exam. additional complaints today:   Hypertension: pt states medications made her feel weird but cannot specify reaction or allergies. No Cp/SOB  DM2: diet controlled  Memory: problems with likely dementia. No agitation or dangerous behavior such as forgetfulness, wandering, leaving stove on or car running etc. Does not drive. Has good family support.   Pt states she is on no oral medications. Son confirms use of Exelon patch   Past medical, surgical, social and family history reviewed:  Patient Active Problem List   Diagnosis Date Noted  . Forehead laceration 11/13/2014  . Fall 11/13/2014  . Finger injury 11/12/2014  . Hyperlipidemia 11/25/2013  . Adenomatous polyp of colon 11/25/2013  . Type 2 diabetes mellitus with renal manifestations, controlled ([place]) 11/24/2013  . Autoimmune hepatitis ([place]) 11/21/2013  . Encounter for long term current azathioprine therapy 11/21/2013  . Alzheimer's disease 11/21/2013  . Diabetic nephropathy associated with type 2 diabetes mellitus ([place]) 11/21/2013  . Essential hypertension, benign 11/21/2013  . Insomnia 11/21/2013  . Osteoporosis 11/21/2013    History reviewed. No pertinent surgical history.  Social History   Social History  . Marital status: Widowed    Spouse name: N/A  . Number of children: N/A  . Years of education: N/A   Occupational History  . Not on file.   Social History Main Topics  . Smoking status: Never Smoker  . Smokeless tobacco: Never Used  . Alcohol use Not on file  . Drug use: No  . Sexual activity: No   Other Topics Concern  . Not on file   Social History Narrative  . No narrative on file    Family History  Problem Relation Age of Onset  . Diabetes Unknown      Current medication  list and allergy/intolerance information reviewed:    Outpatient Encounter Prescriptions as of 09/11/2016  Medication Sig  . azaTHIOprine (IMURAN) 50 MG tablet Take 1 tablet (50 mg total) by mouth daily. To protect liver from autoimmune hepatitis.  . Cholecalciferol (VITAMIN D3) 2000 UNITS capsule Take 1 capsule (2,000 Units total) by mouth daily.  . rivastigmine (EXELON) 4.6 mg/24hr Place 1 patch (4.6 mg total) onto the skin daily. To help with memory.  . valsartan (DIOVAN) 80 MG tablet Take 1 tablet (80 mg total) by mouth daily.  . [DISCONTINUED] amoxicillin (AMOXIL) 875 MG tablet Take 1 tablet (875 mg total) by mouth 2 (two) times daily. (Rx void after 05/06/16) (Patient not taking: Reported on 09/11/2016)   No facility-administered encounter medications on file as of 09/11/2016.     Allergies  Allergen Reactions  . Fosamax [Alendronate Sodium]   . Lisinopril        Review of Systems: Review of Systems - General ROS: negative   Medicare Wellness Questionnaire  Are there smokers in your home (other than you)? no  Depression Screen (Note: if answer to either of the following is "Yes", a more complete depression screening is indicated)   Q1: Over the past two weeks, have you felt down, depressed or hopeless? no  Q2: Over the past two weeks, have you felt little interest or pleasure in doing things? no  Have you lost interest or pleasure in daily life? no  Do you often feel hopeless? no  Do you cry easily over simple problems? no  Activities of Daily Living In your present state of health, do you have any difficulty performing the following activities?:  Driving? yes Managing money?  yes Feeding yourself? no Getting from bed to chair? no Climbing a flight of stairs? no Preparing food and eating?: yes Bathing or showering? yes Getting dressed: no Getting to the toilet? no Using the toilet: no Moving around from place to place: yes In the past year have you fallen or had a  near fall?: yes  Hearing Difficulties:  Do you often ask people to speak up or repeat themselves? no Do you experience ringing or noises in your ears? no  Do you have difficulty understanding soft or whispered voices? yes  Memory Difficulties:  Do you feel that you have a problem with memory? yes  Do you often misplace items? yes  Do you feel safe at home?  yes  Sexual Health:   Are you sexually active?  No  Do you have more than one partner?  No  Advanced Directives:   Advanced directives discussed: has an advanced directive - a copy HAS NOT been provided.  Additional information provided: no  Risk Factors  Current exercise habits: none, occasionaly walking   Dietary issues discussed:no concerns  Cardiac risk factors: Diabetes Mellitus, hypertension, hypercholesterolemia/hyperlipidemia, generalized debilitation, positive family history   Exam:  BP (!) 177/91   Pulse 77   Ht 5\' 1"  (1.549 m)   Wt 165 lb (74.8 kg)   BMI 31.18 kg/m   Constitutional: VS see above. General Appearance: alert, well-developed, well-nourished, NAD  Ears, Nose, Mouth, Throat: MMM  Neck: No masses, trachea midline.   Respiratory: Normal respiratory effort. no wheeze, no rhonchi, no rales  Cardiovascular:No lower extremity edema.   Musculoskeletal: Gait normal. No clubbing/cyanosis of digits.   Neurological: Normal balance/coordination. No tremor. Recalls 3 objects and able to read face of watch with correct time.   Skin: warm, dry, intact. No rash/ulcer.   Psychiatric: Normal judgment/insight. Normal mood and affect. Oriented x3.     ASSESSMENT/PLAN:   Encounter for Medicare annual wellness exam  Medicare annual wellness visit, subsequent  Diabetes mellitus without complication ([place]) - Plan: POCT HgB A1C, CBC, COMPLETE METABOLIC PANEL WITH GFR, Lipid panel, TSH  Need for tetanus, diphtheria, and acellular pertussis (Tdap) vaccine in patient of adolescent age or older - Plan: Tdap  vaccine greater than or equal to 7yo IM  Hypertension, unspecified type - Plan: valsartan (DIOVAN) 40 MG tablet, CBC, COMPLETE METABOLIC PANEL WITH GFR, Lipid panel, TSH  At risk for injury related to fall - Advised calcium and vitamin D, declines physical therapy  Osteoporosis without current pathological fracture, unspecified osteoporosis type - Declines medications     Immunization History  Administered Date(s) Administered  . Influenza, High Dose Seasonal PF 12/06/2015  . Influenza,inj,Quad PF,36+ Mos 02/24/2014, 03/31/2015  . Influenza-Unspecified 11/29/2012  . Pneumococcal-Unspecified 08/13/2006  . Td 10/16/1995  . Tdap 09/11/2016    During the course of the visit the patient was educated and counseled about appropriate screening and preventive services as noted above.   Patient Instructions (the written plan) was given to the patient.  Patient Instructions  Thanks for coming in today! Plan:  Restart blood pressure medications to reduce risk of heart attack and stroke  Let me know when due for refill on the patches  We are getting labs today for medication safety   I encourage patients to give [place] a copy of any Advanced Directives (Living Will, etc) to have  in their charts in case of serious illness, I'm happy to answer any questions you may have about these kinds of forms so feel free to bring them with you to your next visit.     Please note: Preventive care issues were addressed today per annual physical requirements and should be covered under your insurance, however there were other medical issues which were also addressed and insurance may bill you separately for "problem-based visit." Any questions or concerns about charges which may appear on your bill should be directed to your insurance company or to [facility] billing department, please contact our office with any other questions.     Medicare Attestation I have personally reviewed: The patient's medical  and social history Their use of alcohol, tobacco or illicit drugs Their current medications and supplements The patient's functional ability including ADLs,fall risks, home safety risks, cognitive, and hearing and visual impairment Diet and physical activities Evidence for depression or mood disorders  The patient's weight, height, BMI, and visual acuity have been recorded in the chart.  I have made referrals, counseling, and provided education to the patient based on review of the above and I have provided the patient with a written personalized care plan for preventive services.     [name], DO   09/11/16   Visit summary with medication list and pertinent instructions was printed for patient to review. All questions at time of visit were answered - patient instructed to contact office with any additional concerns. ER/RTC precautions were reviewed with the patient. Follow-up plan: Return in about 2 weeks (around 09/25/2016) for recheck blood pressure .

## 2016-09-12 LAB — COMPLETE METABOLIC PANEL WITH GFR
ALT: 9 U/L (ref 6–29)
AST: 15 U/L (ref 10–35)
Albumin: 4.4 g/dL (ref 3.6–5.1)
Alkaline Phosphatase: 78 U/L (ref 33–130)
BILIRUBIN TOTAL: 0.4 mg/dL (ref 0.2–1.2)
BUN: 18 mg/dL (ref 7–25)
CALCIUM: 10.2 mg/dL (ref 8.6–10.4)
CHLORIDE: 111 mmol/L — AB (ref 98–110)
CO2: 15 mmol/L — AB (ref 20–31)
CREATININE: 1.01 mg/dL — AB (ref 0.60–0.88)
GFR, EST AFRICAN AMERICAN: 59 mL/min — AB (ref >=60)
GFR, EST NON AFRICAN AMERICAN: 52 mL/min — AB (ref >=60)
GLUCOSE: 115 mg/dL — AB (ref 65–99)
POTASSIUM: 4.5 mmol/L (ref 3.5–5.3)
Sodium: 143 mmol/L (ref 135–146)
Total Protein: 8 g/dL (ref 6.1–8.1)

## 2016-09-12 LAB — LIPID PANEL
CHOL/HDL RATIO: 3.1 ratio (ref <5.0)
Cholesterol: 218 mg/dL — ABNORMAL HIGH (ref <200)
HDL: 71 mg/dL (ref >50)
LDL Cholesterol: 126 mg/dL — ABNORMAL HIGH (ref <100)
Triglycerides: 103 mg/dL (ref <150)
VLDL: 21 mg/dL (ref <30)

## 2016-09-12 LAB — TSH: TSH: 1.02 m[IU]/L

## 2016-09-25 ENCOUNTER — Ambulatory Visit: Payer: Medicare Other | Admitting: Osteopathic Medicine

## 2016-09-25 DIAGNOSIS — Z0189 Encounter for other specified special examinations: Secondary | ICD-10-CM

## 2016-09-26 ENCOUNTER — Ambulatory Visit (INDEPENDENT_AMBULATORY_CARE_PROVIDER_SITE_OTHER): Payer: Medicare Other | Admitting: Osteopathic Medicine

## 2016-09-26 VITALS — BP 145/75 | HR 80

## 2016-09-26 DIAGNOSIS — I10 Essential (primary) hypertension: Secondary | ICD-10-CM | POA: Diagnosis not present

## 2016-09-26 DIAGNOSIS — I499 Cardiac arrhythmia, unspecified: Secondary | ICD-10-CM

## 2016-09-26 NOTE — Progress Notes (Signed)
HPI: Danielle Mcdowell is a 83 y.o. female  who presents to [place] today, 09/26/16,  for chief complaint of:  Chief Complaint  Patient presents with  . Hypertension    follow up after restart medications     Patient is unclear about whether she is taking medications - sounds like she is taking them intermittently, cannot tell me if in the AM or PM or associated with any habit or time of day.   Patient is accompanied by daughter who assists with history-taking. Agrees with above. Patient is managing her own medications.    Past medical history, surgical history, social history and family history reviewed.  Patient Active Problem List   Diagnosis Date Noted  . Forehead laceration 11/13/2014  . Fall 11/13/2014  . Finger injury 11/12/2014  . Hyperlipidemia 11/25/2013  . Adenomatous polyp of colon 11/25/2013  . Type 2 diabetes mellitus with renal manifestations, controlled ([place]) 11/24/2013  . Autoimmune hepatitis ([place]) 11/21/2013  . Encounter for long term current azathioprine therapy 11/21/2013  . Alzheimer's disease 11/21/2013  . Diabetic nephropathy associated with type 2 diabetes mellitus ([place]) 11/21/2013  . Essential hypertension, benign 11/21/2013  . Insomnia 11/21/2013  . Osteoporosis 11/21/2013    Current medication list and allergy/intolerance information reviewed.   Current Outpatient Prescriptions on File Prior to Visit  Medication Sig Dispense Refill  . rivastigmine (EXELON) 4.6 mg/24hr Place 1 patch (4.6 mg total) onto the skin daily. To help with memory. 30 patch 12  . valsartan (DIOVAN) 40 MG tablet Take 1 tablet (40 mg total) by mouth daily. 30 tablet 1   No current facility-administered medications on file prior to visit.    Allergies  Allergen Reactions  . Fosamax [Alendronate Sodium]   . Lisinopril       Review of Systems:  Constitutional: No recent illness  HEENT: No  headache  Cardiac: No  chest pain, No   pressure, No palpitations  Respiratory:  No  shortness of breath.   Neurologic: No  weakness, No  Dizziness   Exam:  BP (!) 145/75   Pulse 80   Constitutional: VS see above. General Appearance: alert, well-developed, well-nourished, NAD  Eyes: Normal lids and conjunctive, non-icteric sclera  Ears, Nose, Mouth, Throat: MMM, Normal external inspection ears/nares/mouth/lips/gums.  Neck: No masses, trachea midline.   Respiratory: Normal respiratory effort. no wheeze, no rhonchi, no rales  Cardiovascular: S1/S2 normal, no murmur, no rub/gallop auscultated. Irreg/Irreg rhythm.   Musculoskeletal: Gait normal. Symmetric and independent movement of all extremities  Neurological: Normal balance/coordination. No tremor.  Skin: warm, dry, intact.   Psychiatric: Normal judgment/insight. Normal mood and affect. Oriented x3.    EKG interpretation: Rate: 65 Rhythm: sinus Irregular rhythm but would not really classify this as sinus polyps or ectopic rhythm Few biphasic P waves concerning for possible atrial enlargement No ST/T changes concerning for acute ischemia/infarct    ASSESSMENT/PLAN:   Hypertension, unspecified type - Plan: BASIC METABOLIC PANEL WITH GFR  Irregular heartbeat - No ectopic premature atrial contractions noted on EKG, borderline rate, in the 60s. Asymptomatic. Offered cardio referral but pt would like to defer for now - daughter is okay with this plan. Advised on symptoms to watch out for, consideration for sick sinus syndrome and if any worse would seek cardiology opinion    Patient Instructions   Thanks for coming in today!   Please be sure to take your medication at the same time every day - whatever helps you remember it  and stay in the habit!   Your heart beats a little slow with an irregular rhythm, but as long as you're feeling okay there is really nothing to do. We might want to plan on EKG every 6 months or so to keep an eye on things.  .    Follow-up plan: Return in about 6 months (around 03/28/2017) for [place] .  Visit summary with medication list and pertinent instructions was printed for patient to review, alert [place] if any changes needed. All questions at time of visit were answered - patient instructed to contact office with any additional concerns. ER/RTC precautions were reviewed with the patient and understanding verbalized.

## 2016-09-26 NOTE — Patient Instructions (Addendum)
   Thanks for coming in today!   Please be sure to take your medication at the same time every day - whatever helps you remember it and stay in the habit!   Your heart beats a little slow with an irregular rhythm, but as long as you're feeling okay there is really nothing to do. We might want to plan on EKG every 6 months or so to keep an eye on things. Danielle Mcdowell

## 2016-09-27 LAB — BASIC METABOLIC PANEL WITH GFR
BUN: 12 mg/dL (ref 7–25)
CALCIUM: 9.5 mg/dL (ref 8.6–10.4)
CHLORIDE: 107 mmol/L (ref 98–110)
CO2: 19 mmol/L — AB (ref 20–31)
CREATININE: 1 mg/dL — AB (ref 0.60–0.88)
GFR, Est African American: 60 mL/min (ref >=60)
GFR, Est Non African American: 52 mL/min — ABNORMAL LOW (ref >=60)
GLUCOSE: 99 mg/dL (ref 65–99)
Potassium: 4.4 mmol/L (ref 3.5–5.3)
Sodium: 138 mmol/L (ref 135–146)

## 2016-10-03 NOTE — Addendum Note (Signed)
Addended by: Huel Cote on: 10/03/2016 04:53 PM   Modules accepted: Orders

## 2016-11-17 ENCOUNTER — Other Ambulatory Visit: Payer: Self-pay | Admitting: Osteopathic Medicine

## 2016-11-17 ENCOUNTER — Telehealth: Payer: Self-pay | Admitting: Family Medicine

## 2016-11-17 DIAGNOSIS — I10 Essential (primary) hypertension: Secondary | ICD-10-CM

## 2016-11-17 NOTE — Telephone Encounter (Signed)
Please call patient: I do not see an eye exam on the chart. Please see where she goes and call for a copy.

## 2016-11-17 NOTE — Telephone Encounter (Signed)
lvm asking that pt call clinic on Monday to let us know where she gets her eye exam done.Danielle Mcdowell

## 2016-12-14 ENCOUNTER — Other Ambulatory Visit: Payer: Self-pay

## 2016-12-14 MED ORDER — RIVASTIGMINE 4.6 MG/24HR TD PT24: 4.6000 mg | MEDICATED_PATCH | Freq: Every day | TRANSDERMAL | 6 refills | Status: DC

## 2016-12-19 ENCOUNTER — Telehealth: Payer: Self-pay | Admitting: Osteopathic Medicine

## 2016-12-19 MED ORDER — IRBESARTAN 150 MG PO TABS: 150.0000 mg | ORAL_TABLET | Freq: Every day | ORAL | 1 refills | Status: DC

## 2016-12-19 NOTE — Telephone Encounter (Signed)
Received request for alternative to recalled valsartan. I sent medication into pharmacy. She should schedule a follow-up in 2 weeks to recheck blood pressure on new medicine

## 2016-12-20 NOTE — Telephone Encounter (Signed)
Left message on patient daughter phone to call the office and schedule an appointment in 2 weeks to follow up on blood pressure. Mykah Shin,CMA

## 2016-12-27 ENCOUNTER — Ambulatory Visit (INDEPENDENT_AMBULATORY_CARE_PROVIDER_SITE_OTHER): Payer: Medicare Other | Admitting: Osteopathic Medicine

## 2016-12-27 ENCOUNTER — Encounter: Payer: Self-pay | Admitting: Osteopathic Medicine

## 2016-12-27 VITALS — BP 145/87 | HR 60 | Ht 65.0 in | Wt 165.0 lb

## 2016-12-27 DIAGNOSIS — R82998 Other abnormal findings in urine: Secondary | ICD-10-CM

## 2016-12-27 DIAGNOSIS — F0281 Dementia in other diseases classified elsewhere with behavioral disturbance: Secondary | ICD-10-CM

## 2016-12-27 DIAGNOSIS — N3090 Cystitis, unspecified without hematuria: Secondary | ICD-10-CM | POA: Diagnosis not present

## 2016-12-27 DIAGNOSIS — Z23 Encounter for immunization: Secondary | ICD-10-CM | POA: Diagnosis not present

## 2016-12-27 DIAGNOSIS — R8299 Other abnormal findings in urine: Secondary | ICD-10-CM

## 2016-12-27 DIAGNOSIS — F0280 Dementia in other diseases classified elsewhere without behavioral disturbance: Secondary | ICD-10-CM | POA: Diagnosis not present

## 2016-12-27 DIAGNOSIS — G308 Other Alzheimer's disease: Secondary | ICD-10-CM

## 2016-12-27 LAB — COMPLETE METABOLIC PANEL WITH GFR
AG RATIO: 1.4 (calc) (ref 1.0–2.5)
ALBUMIN MSPROF: 4.4 g/dL (ref 3.6–5.1)
ALT: 7 U/L (ref 6–29)
AST: 11 U/L (ref 10–35)
Alkaline phosphatase (APISO): 79 U/L (ref 33–130)
BILIRUBIN TOTAL: 0.5 mg/dL (ref 0.2–1.2)
BUN / CREAT RATIO: 10 (calc) (ref 6–22)
BUN: 11 mg/dL (ref 7–25)
CALCIUM: 9.7 mg/dL (ref 8.6–10.4)
CO2: 24 mmol/L (ref 20–32)
Chloride: 102 mmol/L (ref 98–110)
Creat: 1.06 mg/dL — ABNORMAL HIGH (ref 0.60–0.88)
GFR, EST AFRICAN AMERICAN: 56 mL/min/{1.73_m2} — AB (ref >=60)
GFR, EST NON AFRICAN AMERICAN: 49 mL/min/{1.73_m2} — AB (ref >=60)
GLOBULIN: 3.2 g/dL (ref 1.9–3.7)
Glucose, Bld: 238 mg/dL — ABNORMAL HIGH (ref 65–99)
POTASSIUM: 3.7 mmol/L (ref 3.5–5.3)
SODIUM: 136 mmol/L (ref 135–146)
TOTAL PROTEIN: 7.6 g/dL (ref 6.1–8.1)

## 2016-12-27 LAB — CBC WITH DIFFERENTIAL/PLATELET
Basophils Absolute: 56 cells/uL (ref 0–200)
Basophils Relative: 0.6 %
EOS ABS: 121 {cells}/uL (ref 15–500)
Eosinophils Relative: 1.3 %
HEMATOCRIT: 42.9 % (ref 35.0–45.0)
Hemoglobin: 14.3 g/dL (ref 11.7–15.5)
LYMPHS ABS: 2204 {cells}/uL (ref 850–3900)
MCH: 30.3 pg (ref 27.0–33.0)
MCHC: 33.3 g/dL (ref 32.0–36.0)
MCV: 90.9 fL (ref 80.0–100.0)
MPV: 10 fL (ref 7.5–12.5)
Monocytes Relative: 3.8 %
Neutro Abs: 6566 cells/uL (ref 1500–7800)
Neutrophils Relative %: 70.6 %
Platelets: 232 10*3/uL (ref 140–400)
RBC: 4.72 10*6/uL (ref 3.80–5.10)
RDW: 12 % (ref 11.0–15.0)
Total Lymphocyte: 23.7 %
WBC mixed population: 353 cells/uL (ref 200–950)
WBC: 9.3 10*3/uL (ref 3.8–10.8)

## 2016-12-27 LAB — POCT URINALYSIS DIPSTICK
BILIRUBIN UA: NEGATIVE
Blood, UA: NEGATIVE
GLUCOSE UA: NEGATIVE
Ketones, UA: NEGATIVE
Nitrite, UA: NEGATIVE
PH UA: 5.5 (ref 5.0–8.0)
PROTEIN UA: NEGATIVE
SPEC GRAV UA: 1.02 (ref 1.010–1.025)
UROBILINOGEN UA: 0.2 U/dL

## 2016-12-27 LAB — TSH: TSH: 0.89 mIU/L (ref 0.40–4.50)

## 2016-12-27 LAB — URINALYSIS, MICROSCOPIC ONLY
Bacteria, UA: NONE SEEN /HPF
Hyaline Cast: NONE SEEN /LPF
RBC / HPF: NONE SEEN /HPF (ref 0–2)

## 2016-12-27 MED ORDER — CIPROFLOXACIN HCL 500 MG PO TABS: 500.0000 mg | ORAL_TABLET | Freq: Two times a day (BID) | ORAL | 0 refills | Status: DC
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
## 2016-12-27 NOTE — Addendum Note (Signed)
Addended by: Maryla Morrow on: 12/27/2016 04:44 PM   Modules accepted: Orders

## 2016-12-27 NOTE — Patient Instructions (Addendum)
Plan:  Dementia can cause occasional episodes of worsening confusion, acting differently, agitation or violent outbursts. If there is dramatic change - especially if there is also weakness/lethargy, fever, change in appetite or other symptoms - we should look for physical causes as well such as infection, thyroid problems, new stroke symptoms, etc.   See below for more information on care of people with dementia. This is a very frustrating disease for patients but also for caregivers.   We can give medications for severe agitation or other psychiatric symptoms such as depression, but unfortunately these medicines are unlikely to improve overall decline. If you would like to discuss medications for symptoms, we can certainly talk more about this or send you to a refer   Alzheimer Disease Caregiver Guide Alzheimer disease is an illness that affects a person's brain. It causes a person to lose the ability to remember things and make good decisions. As the disease progresses, the person is unable to take care of himself or herself and needs more and more help to do simple tasks. Taking care of someone with Alzheimer disease can be very challenging and overwhelming. Memory loss and confusion Memory loss and confusion is mild in the beginning stages of the disease. Both of these problems become more severe as the disease progresses. Eventually, the person will not recognize places or even close family members and friends.  Stay calm.  Respond with a short explanation. Long explanations can be overwhelming and confusing.  Avoid corrections that sound like scolding.  Try not to take it personally, even if the person forgets your name.  Behavior changes Behavior changes are part of the disease. The person may develop depression, anxiety, anger, hallucinations, or other behavior changes. These changes can come on suddenly and may be in response to pain, infection, changes in the environment  (temperature, noise), overstimulation, or feeling lost or scared.  Try not to take behavior changes personally.  Remain calm and patient.  Do not argue or try to convince the person about a specific point. This will only make him or her more agitated.  Know that the behavior changes are part of the disease process and try to work through it.  Tips to reduce frustration  Schedule wisely by making appointments and doing daily tasks, like bathing and dressing, when the person is at his or her best.  Take your time. Simple tasks may take a lot longer, so be sure to allow for plenty of time.  Limit choices. Too many choices can be overwhelming and stressful for the person.  Involve the person in what you are doing.  Stick to a routine.  Avoid new or crowded situations, if possible.  Use simple words, short sentences, and a calm voice. Only give one direction at a time.  Buy clothes and shoes that are easy to put on and take off.  Let people help if they offer. Home safety Keeping the home safe is very important to reduce the risk of falls and injuries.  Keep floors clear of clutter. Remove rugs, magazine racks, and floor lamps.  Keep hallways well lit.  Put a handrail and nonslip mat in the bathtub or shower.  Put childproof locks on cabinets with dangerous items, such as medicine, alcohol, guns, toxic cleaning items, sharp tools or utensils, matches, or lighters.  Place locks on doors where the person cannot easily see or reach them. This helps ensure that the person cannot wander out of the house and get lost.  Be prepared for emergencies. Keep a list of emergency phone numbers and addresses in a convenient area.  Plans for the future  Do not put off talking about finances. ? Talk about money management. People with Alzheimer disease have trouble managing their money as the disease gets worse. ? Get help from professional advisors regarding financial and legal  matters.  Do not put off talking about future care. ? Choose a power of attorney. This is someone who can make decisions for the person with Alzheimer disease when he or she is no longer able to do so. ? Talk about driving and when it is the right time to stop. The person's health care provider can help give advice on this matter. ? Talk about the person's living situation. If he or she lives alone, you need to make sure he or she is safe. Some people need extra help at home, and others need more care at a nursing home or care center. Support groups Joining a support group can be very helpful for caregivers of people with Alzheimer disease. Some advantages to being part of a support group include:  Getting strategies to manage stress.  Sharing experiences with others.  Receiving emotional comfort and support.  Learning new caregiving skills as the disease progresses.  Knowing what community resources are available and taking advantage of them.  Contact a health care provider if:  The person has a fever.  The person has a sudden change in behavior that does not improve with calming strategies.  The person is unable to manage in his or her current living situation.  The person threatens you or anyone else, including himself or herself.  You are no longer able to care for the person. This information is not intended to replace advice given to you by your health care provider. Make sure you discuss any questions you have with your health care provider. Document Released: 12/07/2003 Document Revised: 09/08/2015 Document Reviewed: 05/03/2011 Elsevier Interactive Patient Education  2017 [employer].   Other dementia resources:   Alzheimer's Association  VerifiedMovies.de  Phone: 6366115386  [place] Alzheimer's Disease Education and Referral Website  http://black-clark.com/  Phone: [place]  http://www.caregiver.org  Phone:  (786)010-1725  Phone: [phone]

## 2016-12-28 LAB — URINE CULTURE
MICRO NUMBER: 81036174
MICRO NUMBER: 81035754
Result:: NO GROWTH
Result:: NO GROWTH
SPECIMEN QUALITY: ADEQUATE
SPECIMEN QUALITY: ADEQUATE

## 2016-12-28 MED ORDER — ONDANSETRON 8 MG PO TBDP: 8.0000 mg | ORAL_TABLET | Freq: Three times a day (TID) | ORAL | 3 refills | Status: DC | PRN

## 2016-12-28 NOTE — Addendum Note (Signed)
Addended by: Maryla Morrow on: 12/28/2016 03:30 PM   Modules accepted: Orders

## 2017-03-28 ENCOUNTER — Encounter: Payer: Medicare Other | Admitting: Osteopathic Medicine

## 2017-03-28 ENCOUNTER — Telehealth: Payer: Self-pay | Admitting: Osteopathic Medicine

## 2017-03-28 DIAGNOSIS — Z0189 Encounter for other specified special examinations: Secondary | ICD-10-CM

## 2017-03-28 NOTE — Telephone Encounter (Signed)
Please call patient: she did not show to her appointment today - daughter called late to cancel. This is the patient's second no-show in the past year. Please call to make sure everything is ok and see if she would like to reschedule. Please remind her of clinic policy that 3 no-shows or late cancellation in 1 year will result in dismissal from the practice, to protect access to appointments for all our patients.    NO SHOW 09/25/16 follow-up with Dr Sheppard Coil NO SHOW 03/28/17 annual (40 min visit) with Dr Sheppard Coil - daughter called 11:30 to cancel 1:40 appt

## 2017-04-12 ENCOUNTER — Other Ambulatory Visit: Payer: Self-pay | Admitting: Osteopathic Medicine

## 2017-05-16 ENCOUNTER — Other Ambulatory Visit: Payer: Self-pay

## 2017-05-16 MED ORDER — IRBESARTAN 150 MG PO TABS: 150.0000 mg | ORAL_TABLET | Freq: Every day | ORAL | 0 refills | Status: DC

## 2017-05-24 ENCOUNTER — Other Ambulatory Visit: Payer: Self-pay | Admitting: Osteopathic Medicine

## 2017-07-05 ENCOUNTER — Encounter: Payer: Self-pay | Admitting: Osteopathic Medicine

## 2017-07-05 ENCOUNTER — Ambulatory Visit (INDEPENDENT_AMBULATORY_CARE_PROVIDER_SITE_OTHER): Payer: Medicare Other | Admitting: Osteopathic Medicine

## 2017-07-05 VITALS — BP 155/63 | HR 74 | Wt 161.1 lb

## 2017-07-05 DIAGNOSIS — I499 Cardiac arrhythmia, unspecified: Secondary | ICD-10-CM

## 2017-07-05 DIAGNOSIS — F02818 Dementia in other diseases classified elsewhere, unspecified severity, with other behavioral disturbance: Secondary | ICD-10-CM

## 2017-07-05 DIAGNOSIS — I10 Essential (primary) hypertension: Secondary | ICD-10-CM | POA: Diagnosis not present

## 2017-07-05 DIAGNOSIS — E119 Type 2 diabetes mellitus without complications: Secondary | ICD-10-CM | POA: Diagnosis not present

## 2017-07-05 DIAGNOSIS — Z23 Encounter for immunization: Secondary | ICD-10-CM

## 2017-07-05 DIAGNOSIS — F0281 Dementia in other diseases classified elsewhere with behavioral disturbance: Secondary | ICD-10-CM

## 2017-07-05 DIAGNOSIS — G308 Other Alzheimer's disease: Secondary | ICD-10-CM

## 2017-07-05 LAB — POCT GLYCOSYLATED HEMOGLOBIN (HGB A1C): Hemoglobin A1C: 6.5

## 2017-07-05 MED ORDER — MIRTAZAPINE 15 MG PO TABS: 15.0000 mg | ORAL_TABLET | Freq: Every day | ORAL | 1 refills | Status: DC

## 2017-07-05 NOTE — Progress Notes (Signed)
HPI: Danielle Mcdowell is a 84 y.o. female who  has a past medical history of Alzheimer's disease (11/21/2013), Autoimmune hepatitis ([place]) (11/21/2013), Essential hypertension, benign (11/21/2013), Osteoporosis (11/21/2013), and Type 2 diabetes mellitus ([place]) (11/21/2013).  she presents to [facility] today, 07/05/17,  for chief complaint of:  Not taking meds, not eating  Daughter is worried mom is not taking her medications and appetite is low. She found the pills in a bottle in the bathroom as if mom was just taking them and moving them in there. Mom will take pills if daughter watches her do it. No dramatic weight loss. No falls.   Diet controlled DM2, hasn't been checked in some time.   HTN: elevated today, daughter states mom hasn't been taking her meds   More irritability and agitation. She has started to wander on occasion, asks same questions over and over.   Patient is accompanied by daughter who assists with history-taking.   Past medical history, surgical history, social history and family history reviewed. No updates needed.   Current medication list and allergy/intolerance information reviewed.    Current Outpatient Medications on File Prior to Visit  Medication Sig Dispense Refill  . irbesartan (AVAPRO) 150 MG tablet Take 1 tablet (150 mg total) by mouth daily. Pt needs f/u appt w/PCP for further refills. 15 tablet 0  . rivastigmine (EXELON) 4.6 mg/24hr Place 1 patch (4.6 mg total) onto the skin daily. To help with memory. 30 patch 6  . ciprofloxacin (CIPRO) 500 MG tablet Take 1 tablet (500 mg total) by mouth 2 (two) times daily. (Patient not taking: Reported on 07/05/2017) 14 tablet 0  . ondansetron (ZOFRAN-ODT) 8 MG disintegrating tablet Take 1 tablet (8 mg total) by mouth every 8 (eight) hours as needed for nausea. (Patient not taking: Reported on 07/05/2017) 20 tablet 3   No current facility-administered medications on file prior to visit.     Allergies  Allergen Reactions  . Fosamax [Alendronate Sodium]   . Lisinopril       Review of Systems:b as obtained per daugter  Constitutional: No recent illness  HEENT: No  headache, no vision change  Cardiac: No  chest pain, No  pressure, No palpitations  Respiratory:  No  shortness of breath. No  Cough  Gastrointestinal: No  abdominal pain, no change on bowel habits  Musculoskeletal: No new myalgia/arthralgia  Skin: No  Rash  Hem/Onc: No  easy bruising/bleeding, No  abnormal lumps/bumps  Neurologic: No  weakness, No  Dizziness  Psychiatric: No  concerns with depression, +concerns with anxiety per daughter  Exam:  BP (!) 155/63 (BP Location: Left Arm, Patient Position: Sitting, Cuff Size: Normal)   Pulse 74   Wt 161 lb 1.3 oz (73.1 kg)   BMI 26.81 kg/m   Constitutional: VS see above. General Appearance: alert, well-developed, well-nourished, NAD  Eyes: Normal lids and conjunctive, non-icteric sclera  Ears, Nose, Mouth, Throat: MMM, Normal external inspection ears/nares/mouth/lips/gums.  Neck: No masses, trachea midline.   Respiratory: Normal respiratory effort. no wheeze, no rhonchi, no rales  Cardiovascular: S1/S2 normal, no murmur, no rub/gallop auscultated. Irregularly irregular rhythm, rate is WNL.   Musculoskeletal: Gait normal. Symmetric and independent movement of all extremities  Neurological: Normal balance/coordination. No tremor.  Skin: warm, dry, intact.   Psychiatric: Normal judgment/insight. Normal mood and affect. Oriented x3.   EKG interpretation: Rate: 69  Rhythm: sinus Irregular but there are P waves. Sinus arrhythmia.  No ST/T changes concerning for acute ischemia/infarct  Previous EKG PAC's  Results for orders placed or performed in visit on 07/05/17 (from the past 24 hour(s))  POCT HgB A1C     Status: None   Collection Time: 07/05/17 11:09 AM  Result Value Ref Range   Hemoglobin A1C 6.5      ASSESSMENT/PLAN:    Alzheimer's disease of other onset with behavioral disturbance - Trial remeron to help appetite and sleep, consider other psychiatric medications or referral as needed. Consider placement, daughter would like home health - Plan: EKG 12-Lead  Need for pneumococcal vaccination - Plan: Pneumococcal polysaccharide vaccine 23-valent greater than or equal to 2yo subcutaneous/IM  Diabetes mellitus without complication ([place]) - Plan: POCT HgB A1C  Irregular heart beat - sinus arrhythmia, asymptomatic   Hypertension, unspecified type   Meds ordered this encounter  Medications  . mirtazapine (REMERON) 15 MG tablet    Sig: Take 1 tablet (15 mg total) by mouth at bedtime.    Dispense:  30 tablet    Refill:  1     Follow-up plan: Return in about 1 month (around 08/02/2017) for recheck on new medicine (remeron) for sleep/mood/appetite .  Visit summary with medication list and pertinent instructions was printed for patient to review, alert [place] if any changes needed. All questions at time of visit were answered - patient instructed to contact office with any additional concerns. ER/RTC precautions were reviewed with the patient and understanding verbalized.   Note: Total time spent 40 minutes, greater than 50% of the visit was spent face-to-face counseling and coordinating care for the following: The primary encounter diagnosis was Alzheimer's disease of other onset with behavioral disturbance. Diagnoses of Need for pneumococcal vaccination, Diabetes mellitus without complication ([place]), and Irregular heart beat were also pertinent to this visit.Marland Kitchen  Please note: voice recognition software was used to produce this document, and typos may escape review. Please contact Dr. Sheppard Coil for any needed clarifications.

## 2017-07-09 ENCOUNTER — Other Ambulatory Visit: Payer: Self-pay

## 2017-07-09 MED ORDER — IRBESARTAN 150 MG PO TABS: 150.0000 mg | ORAL_TABLET | Freq: Every day | ORAL | 3 refills | Status: DC

## 2017-07-17 ENCOUNTER — Telehealth: Payer: Self-pay

## 2017-07-17 NOTE — Telephone Encounter (Signed)
[name] called requesting verbal authorization to begin pt's home health services. [facility] was trying to reach pt's daughter for the past 2 wks for scheduling. Daughter requesting for home health services to be initiated on 07/19/17. Attempted to contact [name] to give verbal approval, however no answer. Left a vm msg with call back information for aide.

## 2017-07-17 NOTE — Telephone Encounter (Signed)
Tried calling Ms. Dobson at [facility], no answer. Left a brief vm msg with verbal authorization. Call back information provided.

## 2017-07-17 NOTE — Telephone Encounter (Signed)
Okay to give verbal order, they can fax me any paper work which requires a signature. Thank you!

## 2017-07-19 DIAGNOSIS — I10 Essential (primary) hypertension: Secondary | ICD-10-CM | POA: Diagnosis not present

## 2017-07-19 DIAGNOSIS — E119 Type 2 diabetes mellitus without complications: Secondary | ICD-10-CM | POA: Diagnosis not present

## 2017-07-19 DIAGNOSIS — F0280 Dementia in other diseases classified elsewhere without behavioral disturbance: Secondary | ICD-10-CM | POA: Diagnosis not present

## 2017-07-19 DIAGNOSIS — G309 Alzheimer's disease, unspecified: Secondary | ICD-10-CM | POA: Diagnosis not present

## 2017-07-22 ENCOUNTER — Other Ambulatory Visit: Payer: Self-pay | Admitting: Osteopathic Medicine

## 2017-07-24 ENCOUNTER — Telehealth: Payer: Self-pay

## 2017-07-24 DIAGNOSIS — G309 Alzheimer's disease, unspecified: Secondary | ICD-10-CM | POA: Diagnosis not present

## 2017-07-24 DIAGNOSIS — F0280 Dementia in other diseases classified elsewhere without behavioral disturbance: Secondary | ICD-10-CM | POA: Diagnosis not present

## 2017-07-24 DIAGNOSIS — I10 Essential (primary) hypertension: Secondary | ICD-10-CM | POA: Diagnosis not present

## 2017-07-24 DIAGNOSIS — E119 Type 2 diabetes mellitus without complications: Secondary | ICD-10-CM | POA: Diagnosis not present

## 2017-07-24 NOTE — Telephone Encounter (Signed)
Disregard last message. Verbal authorization for OT noted on 07/19/2017. [facility] did not return call back after numerous messages at time of request. Spoke with Nynica today from [facility], verbal order given. No other inquiries noted during phone call.

## 2017-07-24 NOTE — Telephone Encounter (Signed)
Nynica from [facility] is requesting a verbal order for pt to continue OT sessions. Pls advise, thanks.

## 2017-07-27 DIAGNOSIS — F0280 Dementia in other diseases classified elsewhere without behavioral disturbance: Secondary | ICD-10-CM | POA: Diagnosis not present

## 2017-07-27 DIAGNOSIS — I10 Essential (primary) hypertension: Secondary | ICD-10-CM | POA: Diagnosis not present

## 2017-07-27 DIAGNOSIS — E119 Type 2 diabetes mellitus without complications: Secondary | ICD-10-CM | POA: Diagnosis not present

## 2017-07-27 DIAGNOSIS — G309 Alzheimer's disease, unspecified: Secondary | ICD-10-CM | POA: Diagnosis not present

## 2017-07-30 DIAGNOSIS — I10 Essential (primary) hypertension: Secondary | ICD-10-CM | POA: Diagnosis not present

## 2017-07-30 DIAGNOSIS — G309 Alzheimer's disease, unspecified: Secondary | ICD-10-CM | POA: Diagnosis not present

## 2017-07-30 DIAGNOSIS — F0280 Dementia in other diseases classified elsewhere without behavioral disturbance: Secondary | ICD-10-CM | POA: Diagnosis not present

## 2017-07-30 DIAGNOSIS — E119 Type 2 diabetes mellitus without complications: Secondary | ICD-10-CM | POA: Diagnosis not present

## 2017-08-01 DIAGNOSIS — G309 Alzheimer's disease, unspecified: Secondary | ICD-10-CM | POA: Diagnosis not present

## 2017-08-01 DIAGNOSIS — F0280 Dementia in other diseases classified elsewhere without behavioral disturbance: Secondary | ICD-10-CM | POA: Diagnosis not present

## 2017-08-01 DIAGNOSIS — E119 Type 2 diabetes mellitus without complications: Secondary | ICD-10-CM | POA: Diagnosis not present

## 2017-08-01 DIAGNOSIS — I10 Essential (primary) hypertension: Secondary | ICD-10-CM | POA: Diagnosis not present

## 2017-08-02 ENCOUNTER — Encounter: Payer: Self-pay | Admitting: Osteopathic Medicine

## 2017-08-02 ENCOUNTER — Ambulatory Visit (INDEPENDENT_AMBULATORY_CARE_PROVIDER_SITE_OTHER): Payer: Medicare Other | Admitting: Osteopathic Medicine

## 2017-08-02 VITALS — BP 141/82 | HR 75 | Wt 159.7 lb

## 2017-08-02 DIAGNOSIS — G308 Other Alzheimer's disease: Secondary | ICD-10-CM | POA: Diagnosis not present

## 2017-08-02 DIAGNOSIS — F0281 Dementia in other diseases classified elsewhere with behavioral disturbance: Secondary | ICD-10-CM | POA: Diagnosis not present

## 2017-08-02 DIAGNOSIS — F02818 Dementia in other diseases classified elsewhere, unspecified severity, with other behavioral disturbance: Secondary | ICD-10-CM

## 2017-08-02 MED ORDER — MIRTAZAPINE 15 MG PO TABS: 15.0000 mg | ORAL_TABLET | Freq: Every day | ORAL | 1 refills | Status: DC

## 2017-08-02 NOTE — Patient Instructions (Signed)
Can increase Remeron to 2 tablets at night - this may help sleep/appetite Let's see how she's doing in 4-6 weeks

## 2017-08-02 NOTE — Progress Notes (Signed)
HPI: Danielle Mcdowell is a 84 y.o. female who  has a past medical history of Alzheimer's disease (11/21/2013), Autoimmune hepatitis ([place]) (11/21/2013), Essential hypertension, benign (11/21/2013), Osteoporosis (11/21/2013), and Type 2 diabetes mellitus ([place]) (11/21/2013).  she presents to [facility] today, 08/02/17,  for chief complaint of:  Recheck: Not taking meds, not eating  Last visit 07/05/17: Daughter is worried mom is not taking her medications and appetite is low. She found the pills in a bottle in the bathroom as if mom was just taking them and moving them in there. Mom will take pills if daughter watches her do it. BP was high d/t not taking meds. No dramatic weight loss. No falls. More irritability and agitation. She has started to wander on occasion, asks same questions over and over. Trial Remeron 15 mg qhs to help appetite and sleep, with plan to consider other psychiatric medications or referral as needed. Consider placement for safety, daughter would like home health.   Today, one month later 08/02/17, still not sleeping, still having confusion about daytime versus nighttime - will get up in the middle of the night and get dressed, will sleep through the day. OT and PT came to the house, didn't make much difference. Daughter notes overall good mood, no severe agitation or combative. Reports easily fatigued  Patient is accompanied by daughter who assists with history-taking.    Past medical history, surgical history, social history and family history reviewed.   Current medication list and allergy/intolerance information reviewed.    Current Outpatient Medications on File Prior to Visit  Medication Sig Dispense Refill  . irbesartan (AVAPRO) 150 MG tablet Take 1 tablet (150 mg total) by mouth daily. Pt must keep upcoming appt for f/u on 07/2017 90 tablet 3  . mirtazapine (REMERON) 15 MG tablet Take 1 tablet (15 mg total) by mouth at bedtime. 30  tablet 1  . ondansetron (ZOFRAN-ODT) 8 MG disintegrating tablet Take 1 tablet (8 mg total) by mouth every 8 (eight) hours as needed for nausea. (Patient not taking: Reported on 07/05/2017) 20 tablet 3  . rivastigmine (EXELON) 4.6 mg/24hr PLACE 1 PATCH (4.6 MG TOTAL) ONTO THE SKIN DAILY. TO HELP WITH MEMORY. 30 patch 6   No current facility-administered medications on file prior to visit.    Allergies  Allergen Reactions  . Fosamax [Alendronate Sodium]   . Lisinopril       Review of Systems: as obtained per daugter  Constitutional: No recent illness  HEENT: No  headache  Cardiac: No  chest pain  Respiratory:  +shortness of breath after few minutes of exertion, improved w/ rest. No  Cough  Gastrointestinal: No  abdominal pain, no change on bowel habits  Skin: No  Rash  Neurologic: No  weakness, No  Dizziness   Exam:  BP (!) 141/82 (BP Location: Left Arm, Patient Position: Sitting, Cuff Size: Normal)   Pulse 75   Wt 159 lb 11.2 oz (72.4 kg)   BMI 26.58 kg/m   Constitutional: VS see above. General Appearance: alert, well-developed, well-nourished, NAD  Eyes: Normal lids and conjunctive, non-icteric sclera, EOMI, PERRLA  Ears, Nose, Mouth, Throat: MMM, Normal external inspection ears/nares/mouth/lips/gums.  Neck: No masses, trachea midline.   Respiratory: Normal respiratory effort  Musculoskeletal: Gait normal. Symmetric and independent movement of all extremities  Neurological: Normal balance/coordination. No tremor. No cranial nerve deficit on limited exam   Skin: warm, dry, intact.   Psychiatric: Normal judgment/insight. Normal mood and affect. Oriented x3.  ASSESSMENT/PLAN:   Alzheimer's disease of other onset with behavioral disturbance   Continue Remeron, can consider increase dose, daughter would like to hold off for now. Has been unable to tolerate Aricept, Namenda due to dizziness side effects. OK on Exelon patches. Offered referral to neuropsych,  daughter would like to hold off for now. Unfortunately, dementia symptoms patient is exhibiting may not be helped much by medication, nonpharmacologic interventions limited in terms of round-the-clock care/supervision due to finances. Social worker is helping with this according to daughter.     Meds ordered this encounter  Medications  . mirtazapine (REMERON) 15 MG tablet    Sig: Take 1-2 tablets (15-30 mg total) by mouth at bedtime.    Dispense:  90 tablet    Refill:  1     Follow-up plan: Return in about 6 weeks (around 09/13/2017) for recehck dementia, sooner if needed.  Visit summary with medication list and pertinent instructions was printed for patient to review, alert [place] if any changes needed. All questions at time of visit were answered - patient instructed to contact office with any additional concerns. ER/RTC precautions were reviewed with the patient and understanding verbalized.   Note: Total time spent 25 minutes, greater than 50% of the visit was spent face-to-face counseling and coordinating care for the following: The encounter diagnosis was Alzheimer's disease of other onset with behavioral disturbance..  Please note: voice recognition software was used to produce this document, and typos may escape review. Please contact Dr. Sheppard Coil for any needed clarifications.

## 2017-08-09 DIAGNOSIS — G309 Alzheimer's disease, unspecified: Secondary | ICD-10-CM | POA: Diagnosis not present

## 2017-08-09 DIAGNOSIS — F0280 Dementia in other diseases classified elsewhere without behavioral disturbance: Secondary | ICD-10-CM | POA: Diagnosis not present

## 2017-08-09 DIAGNOSIS — E119 Type 2 diabetes mellitus without complications: Secondary | ICD-10-CM | POA: Diagnosis not present

## 2017-08-09 DIAGNOSIS — I10 Essential (primary) hypertension: Secondary | ICD-10-CM | POA: Diagnosis not present

## 2017-08-10 DIAGNOSIS — E119 Type 2 diabetes mellitus without complications: Secondary | ICD-10-CM | POA: Diagnosis not present

## 2017-08-10 DIAGNOSIS — F0280 Dementia in other diseases classified elsewhere without behavioral disturbance: Secondary | ICD-10-CM | POA: Diagnosis not present

## 2017-08-10 DIAGNOSIS — G309 Alzheimer's disease, unspecified: Secondary | ICD-10-CM | POA: Diagnosis not present

## 2017-08-10 DIAGNOSIS — I10 Essential (primary) hypertension: Secondary | ICD-10-CM | POA: Diagnosis not present

## 2017-08-14 DIAGNOSIS — I10 Essential (primary) hypertension: Secondary | ICD-10-CM | POA: Diagnosis not present

## 2017-08-14 DIAGNOSIS — E119 Type 2 diabetes mellitus without complications: Secondary | ICD-10-CM | POA: Diagnosis not present

## 2017-08-14 DIAGNOSIS — G309 Alzheimer's disease, unspecified: Secondary | ICD-10-CM | POA: Diagnosis not present

## 2017-08-14 DIAGNOSIS — F0280 Dementia in other diseases classified elsewhere without behavioral disturbance: Secondary | ICD-10-CM | POA: Diagnosis not present

## 2017-08-15 DIAGNOSIS — I10 Essential (primary) hypertension: Secondary | ICD-10-CM | POA: Diagnosis not present

## 2017-08-15 DIAGNOSIS — G309 Alzheimer's disease, unspecified: Secondary | ICD-10-CM | POA: Diagnosis not present

## 2017-08-15 DIAGNOSIS — E119 Type 2 diabetes mellitus without complications: Secondary | ICD-10-CM | POA: Diagnosis not present

## 2017-08-15 DIAGNOSIS — F0280 Dementia in other diseases classified elsewhere without behavioral disturbance: Secondary | ICD-10-CM | POA: Diagnosis not present

## 2017-08-16 DIAGNOSIS — G309 Alzheimer's disease, unspecified: Secondary | ICD-10-CM | POA: Diagnosis not present

## 2017-08-16 DIAGNOSIS — I10 Essential (primary) hypertension: Secondary | ICD-10-CM | POA: Diagnosis not present

## 2017-08-16 DIAGNOSIS — F0280 Dementia in other diseases classified elsewhere without behavioral disturbance: Secondary | ICD-10-CM | POA: Diagnosis not present

## 2017-08-16 DIAGNOSIS — E119 Type 2 diabetes mellitus without complications: Secondary | ICD-10-CM | POA: Diagnosis not present

## 2017-08-23 DIAGNOSIS — I10 Essential (primary) hypertension: Secondary | ICD-10-CM | POA: Diagnosis not present

## 2017-08-23 DIAGNOSIS — G309 Alzheimer's disease, unspecified: Secondary | ICD-10-CM | POA: Diagnosis not present

## 2017-08-23 DIAGNOSIS — E119 Type 2 diabetes mellitus without complications: Secondary | ICD-10-CM | POA: Diagnosis not present

## 2017-08-23 DIAGNOSIS — F0280 Dementia in other diseases classified elsewhere without behavioral disturbance: Secondary | ICD-10-CM | POA: Diagnosis not present

## 2017-08-27 ENCOUNTER — Other Ambulatory Visit: Payer: Self-pay | Admitting: Osteopathic Medicine

## 2017-08-27 DIAGNOSIS — G309 Alzheimer's disease, unspecified: Secondary | ICD-10-CM | POA: Diagnosis not present

## 2017-08-27 DIAGNOSIS — F0280 Dementia in other diseases classified elsewhere without behavioral disturbance: Secondary | ICD-10-CM | POA: Diagnosis not present

## 2017-08-27 DIAGNOSIS — E119 Type 2 diabetes mellitus without complications: Secondary | ICD-10-CM | POA: Diagnosis not present

## 2017-08-27 DIAGNOSIS — I10 Essential (primary) hypertension: Secondary | ICD-10-CM | POA: Diagnosis not present

## 2017-09-03 ENCOUNTER — Other Ambulatory Visit: Payer: Self-pay | Admitting: Osteopathic Medicine

## 2017-09-05 ENCOUNTER — Other Ambulatory Visit: Payer: Self-pay | Admitting: Osteopathic Medicine

## 2017-09-13 ENCOUNTER — Encounter: Payer: Self-pay | Admitting: Osteopathic Medicine

## 2017-09-13 ENCOUNTER — Ambulatory Visit (INDEPENDENT_AMBULATORY_CARE_PROVIDER_SITE_OTHER): Payer: Medicare Other | Admitting: Osteopathic Medicine

## 2017-09-13 ENCOUNTER — Ambulatory Visit: Payer: Medicare Other | Admitting: Osteopathic Medicine

## 2017-09-13 ENCOUNTER — Ambulatory Visit (INDEPENDENT_AMBULATORY_CARE_PROVIDER_SITE_OTHER): Payer: Medicare Other

## 2017-09-13 VITALS — BP 110/71 | HR 85 | Wt 160.1 lb

## 2017-09-13 DIAGNOSIS — R8290 Unspecified abnormal findings in urine: Secondary | ICD-10-CM | POA: Diagnosis not present

## 2017-09-13 DIAGNOSIS — K449 Diaphragmatic hernia without obstruction or gangrene: Secondary | ICD-10-CM

## 2017-09-13 DIAGNOSIS — F0281 Dementia in other diseases classified elsewhere with behavioral disturbance: Secondary | ICD-10-CM | POA: Diagnosis not present

## 2017-09-13 DIAGNOSIS — I499 Cardiac arrhythmia, unspecified: Secondary | ICD-10-CM | POA: Diagnosis not present

## 2017-09-13 DIAGNOSIS — E119 Type 2 diabetes mellitus without complications: Secondary | ICD-10-CM

## 2017-09-13 DIAGNOSIS — R0602 Shortness of breath: Secondary | ICD-10-CM

## 2017-09-13 DIAGNOSIS — F02818 Dementia in other diseases classified elsewhere, unspecified severity, with other behavioral disturbance: Secondary | ICD-10-CM

## 2017-09-13 DIAGNOSIS — G308 Other Alzheimer's disease: Secondary | ICD-10-CM | POA: Diagnosis not present

## 2017-09-13 NOTE — Progress Notes (Signed)
HPI: Danielle Mcdowell is a 84 y.o. female who  has a past medical history of Alzheimer's disease (11/21/2013), Autoimmune hepatitis ([place]) (11/21/2013), Essential hypertension, benign (11/21/2013), Osteoporosis (11/21/2013), and Type 2 diabetes mellitus ([place]) (11/21/2013).  she presents to [facility] today, 09/13/17,  for chief complaint of:  Dementia Arm swelling - new  HTN - needs refill  Moods/dementia: More sleeping than usual. Overall medications working well. We increased Remeron to 2 tablets qhs.  Daughter states that this is overall working pretty well to get her sleeping through the night, though she sleeps a lot during the day as well which has been a problem for her before  SOB with exertion, any kind of walking or activity other than sitting around causes some SOB. Daughter reports her mom is very sedentary, really doesn't do anything.  Denies chest pain on exertion or at rest.  No cough or fever.   Patient is accompanied by daughter who assists with history-taking.   Past medical history, surgical history, and family history reviewed.  Current medication list and allergy/intolerance information reviewed.   (See remainder of HPI, ROS, Phys Exam below)  No results found.  No results found for this or any previous visit (from the past 72 hour(s)).   ASSESSMENT/PLAN:   Alzheimer's disease of other onset with behavioral disturbance - Well-controlled on current medications, continue to monitor as needed.  Supportive care discussed  Irregular heart beat - Sound about the same, previous EKGs have shown sinus arrhythmia.  Offered repeat EKG today but family declines, this is certainly reasonable  SOB (shortness of breath) on exertion - BNP not troubling for heart failure.  Chronic debility and deconditioning, could consider PFT though I do not think she would participate well in this with deme - Plan: CBC, COMPLETE METABOLIC PANEL WITH GFR, Lipid  panel, TSH, B Nat Peptide, DG Chest 2 View, Urinalysis, Routine w reflex microscopic  Diabetes mellitus without complication (HCC) - I2L indicates good control, continue to monitor.  Family would like to avoid adding medicines if possible - Plan: Lipid panel, TSH, Hemoglobin A1c  Abnormal urinalysis - Negative for possible UTI, antibiotics were sent - Plan: Urine Culture   Meds ordered this encounter  Medications  . ciprofloxacin (CIPRO) 500 MG tablet    Sig: Take 1 tablet (500 mg total) by mouth 2 (two) times daily.    Dispense:  20 tablet    Refill:  0   Results for orders placed or performed in visit on 09/13/17 (from the past 72 hour(s))  CBC     Status: None   Collection Time: 09/13/17 10:29 AM  Result Value Ref Range   WBC 7.0 3.8 - 10.8 Thousand/uL   RBC 4.70 3.80 - 5.10 Million/uL   Hemoglobin 14.3 11.7 - 15.5 g/dL   HCT 42.7 35.0 - 45.0 %   MCV 90.9 80.0 - 100.0 fL   MCH 30.4 27.0 - 33.0 pg   MCHC 33.5 32.0 - 36.0 g/dL   RDW 12.7 11.0 - 15.0 %   Platelets 213 140 - 400 Thousand/uL   MPV 10.4 7.5 - 12.5 fL  COMPLETE METABOLIC PANEL WITH GFR     Status: Abnormal   Collection Time: 09/13/17 10:29 AM  Result Value Ref Range   Glucose, Bld 134 (H) 65 - 99 mg/dL    Comment: .            Fasting reference interval . For someone without known diabetes, a glucose value >125  mg/dL indicates that they may have diabetes and this should be confirmed with a follow-up test. .    BUN 12 7 - 25 mg/dL   Creat 1.02 (H) 0.60 - 0.88 mg/dL    Comment: For patients >22 years of age, the reference limit for Creatinine is approximately 13% higher for people identified as African-American. .    GFR, Est Non African American 50 (L) > OR = 60 mL/min/1.105m2   GFR, Est African American 58 (L) > OR = 60 mL/min/1.51m2   BUN/Creatinine Ratio 12 6 - 22 (calc)   Sodium 140 135 - 146 mmol/L   Potassium 4.3 3.5 - 5.3 mmol/L   Chloride 106 98 - 110 mmol/L   CO2 25 20 - 32 mmol/L   Calcium  9.5 8.6 - 10.4 mg/dL   Total Protein 7.2 6.1 - 8.1 g/dL   Albumin 4.1 3.6 - 5.1 g/dL   Globulin 3.1 1.9 - 3.7 g/dL (calc)   AG Ratio 1.3 1.0 - 2.5 (calc)   Total Bilirubin 0.6 0.2 - 1.2 mg/dL   Alkaline phosphatase (APISO) 75 33 - 130 U/L   AST 12 10 - 35 U/L   ALT 8 6 - 29 U/L  Lipid panel     Status: Abnormal   Collection Time: 09/13/17 10:29 AM  Result Value Ref Range   Cholesterol 200 (H) <200 mg/dL   HDL 50 (L) >50 mg/dL   Triglycerides 100 <150 mg/dL   LDL Cholesterol (Calc) 129 (H) mg/dL (calc)    Comment: Reference range: <100 . Desirable range <100 mg/dL for primary prevention;   <70 mg/dL for patients with CHD or diabetic patients  with > or = 2 CHD risk factors. Marland Kitchen LDL-C is now calculated using the Martin-Hopkins  calculation, which is a validated novel method providing  better accuracy than the Friedewald equation in the  estimation of LDL-C.  Cresenciano Genre et al. Annamaria Helling. 4098;119(14): 2061-2068  (http://education.[employer].com/faq/FAQ164)    Total CHOL/HDL Ratio 4.0 <5.0 (calc)   Non-HDL Cholesterol (Calc) 150 (H) <130 mg/dL (calc)    Comment: For patients with diabetes plus 1 major ASCVD risk  factor, treating to a non-HDL-C goal of <100 mg/dL  (LDL-C of <70 mg/dL) is considered a therapeutic  option.   TSH     Status: None   Collection Time: 09/13/17 10:29 AM  Result Value Ref Range   TSH 0.90 0.40 - 4.50 mIU/L  B Nat Peptide     Status: None   Collection Time: 09/13/17 10:29 AM  Result Value Ref Range   Brain Natriuretic Peptide 23 <100 pg/mL    Comment: . BNP levels increase with age in the general population with the highest values seen in individuals greater than 40 years of age. Reference: J. Am. [place] Ar. Cardiol. 2002; 78:295-621. Marland Kitchen   Urinalysis, Routine w reflex microscopic     Status: Abnormal   Collection Time: 09/13/17 10:29 AM  Result Value Ref Range   Color, Urine YELLOW YELLOW   APPearance CLEAR CLEAR   Specific Gravity, Urine 1.019  1.001 - 1.03   pH 5.5 5.0 - 8.0   Glucose, UA NEGATIVE NEGATIVE   Bilirubin Urine NEGATIVE NEGATIVE   Ketones, ur NEGATIVE NEGATIVE   Hgb urine dipstick NEGATIVE NEGATIVE   Protein, ur NEGATIVE NEGATIVE   Nitrite POSITIVE (A) NEGATIVE   Leukocytes, UA 2+ (A) NEGATIVE   WBC, UA 10-20 (A) 0 - 5 /HPF   RBC / HPF 0-2 0 - 2 /HPF   Squamous  Epithelial / LPF NONE SEEN < OR = 5 /HPF   Bacteria, UA MANY (A) NONE SEEN /HPF   Hyaline Cast NONE SEEN NONE SEEN /LPF  Hemoglobin A1c     Status: Abnormal   Collection Time: 09/13/17 10:29 AM  Result Value Ref Range   Hgb A1c MFr Bld 6.3 (H) <5.7 % of total Hgb    Comment: For someone without known diabetes, a hemoglobin  A1c value between 5.7% and 6.4% is consistent with prediabetes and should be confirmed with a  follow-up test. . For someone with known diabetes, a value <7% indicates that their diabetes is well controlled. A1c targets should be individualized based on duration of diabetes, age, comorbid conditions, and other considerations. . This assay result is consistent with an increased risk of diabetes. . Currently, no consensus exists regarding use of hemoglobin A1c for diagnosis of diabetes for children. .    Mean Plasma Glucose 134 (calc)   eAG (mmol/L) 7.4 (calc)    There are no Patient Instructions on file for this visit.  Follow-up plan: No follow-ups on file.     ############################################ ############################################ ############################################ ############################################    Outpatient Encounter Medications as of 09/13/2017  Medication Sig  . irbesartan (AVAPRO) 150 MG tablet Take 1 tablet (150 mg total) by mouth daily. Pt must keep upcoming appt for f/u on 07/2017  . mirtazapine (REMERON) 15 MG tablet Take 1-2 tablets (15-30 mg total) by mouth at bedtime.  . ondansetron (ZOFRAN-ODT) 8 MG disintegrating tablet Take 1 tablet (8 mg total) by mouth  every 8 (eight) hours as needed for nausea.  . rivastigmine (EXELON) 4.6 mg/24hr PLACE 1 PATCH (4.6 MG TOTAL) ONTO THE SKIN DAILY. TO HELP WITH MEMORY.   No facility-administered encounter medications on file as of 09/13/2017.    Allergies  Allergen Reactions  . Fosamax [Alendronate Sodium]   . Lisinopril       Review of Systems:  Constitutional: No recent illness  HEENT: No  headache, no vision change  Cardiac: No  chest pain  Respiratory:  +shortness of breath. No  Cough  Gastrointestinal: No  abdominal pain  Musculoskeletal: No new myalgia/arthralgia  Skin: No  Rash  Neurologic: +generalized weakness, No  Dizziness  Exam:  BP 110/71 (BP Location: Left Arm, Patient Position: Sitting, Cuff Size: Large)   Pulse 85   Wt 160 lb 1.6 oz (72.6 kg)   BMI 26.64 kg/m   Constitutional: VS see above. General Appearance: alert, well-developed, well-nourished, NAD  Eyes: Normal lids and conjunctive, non-icteric sclera  Ears, Nose, Mouth, Throat: MMM, Normal external inspection ears/nares/mouth/lips/gums.  Neck: No masses, trachea midline.   Respiratory: Normal respiratory effort. no wheeze, no rhonchi, no rales  Cardiovascular: S1/S2 normal, no murmur, no rub/gallop auscultated. irreg rhythm, regular rate.   Musculoskeletal: Gait normal. Symmetric and independent movement of all extremities  Neurological: Fair balance/coordination.  Skin: warm, dry, intact.   Psychiatric: Poor judgment/insight. Pleasant mood and affect. Oriented x3.   Visit summary with medication list and pertinent instructions was printed for patient to review, advised to alert [place] if any changes needed. All questions at time of visit were answered - patient instructed to contact office with any additional concerns. ER/RTC precautions were reviewed with the patient and understanding verbalized.   Follow-up plan: Return in about 3 months (around 12/14/2017) for recheck dementia, other issues - see me sooner  if needed! .  Note: Total time spent 25 minutes, greater than 50% of the visit was spent face-to-face  counseling and coordinating care for the following: The primary encounter diagnosis was Alzheimer's disease of other onset with behavioral disturbance. Diagnoses of Irregular heart beat, SOB (shortness of breath) on exertion, Diabetes mellitus without complication (HCC), and Abnormal urinalysis were also pertinent to this visit.Marland Kitchen  Please note: voice recognition software was used to produce this document, and typos may escape review. Please contact Dr. Sheppard Coil for any needed clarifications.

## 2017-09-14 ENCOUNTER — Encounter: Payer: Self-pay | Admitting: Osteopathic Medicine

## 2017-09-14 LAB — COMPLETE METABOLIC PANEL WITH GFR
AG Ratio: 1.3 (calc) (ref 1.0–2.5)
ALBUMIN MSPROF: 4.1 g/dL (ref 3.6–5.1)
ALT: 8 U/L (ref 6–29)
AST: 12 U/L (ref 10–35)
Alkaline phosphatase (APISO): 75 U/L (ref 33–130)
BUN / CREAT RATIO: 12 (calc) (ref 6–22)
BUN: 12 mg/dL (ref 7–25)
CO2: 25 mmol/L (ref 20–32)
CREATININE: 1.02 mg/dL — AB (ref 0.60–0.88)
Calcium: 9.5 mg/dL (ref 8.6–10.4)
Chloride: 106 mmol/L (ref 98–110)
GFR, EST AFRICAN AMERICAN: 58 mL/min/{1.73_m2} — AB (ref >=60)
GFR, Est Non African American: 50 mL/min/{1.73_m2} — ABNORMAL LOW (ref >=60)
GLUCOSE: 134 mg/dL — AB (ref 65–99)
Globulin: 3.1 g/dL (calc) (ref 1.9–3.7)
Potassium: 4.3 mmol/L (ref 3.5–5.3)
Sodium: 140 mmol/L (ref 135–146)
TOTAL PROTEIN: 7.2 g/dL (ref 6.1–8.1)
Total Bilirubin: 0.6 mg/dL (ref 0.2–1.2)

## 2017-09-14 LAB — URINALYSIS, ROUTINE W REFLEX MICROSCOPIC
BILIRUBIN URINE: NEGATIVE
GLUCOSE, UA: NEGATIVE
Hgb urine dipstick: NEGATIVE
Hyaline Cast: NONE SEEN /LPF
KETONES UR: NEGATIVE
NITRITE: POSITIVE — AB
Protein, ur: NEGATIVE
SQUAMOUS EPITHELIAL / LPF: NONE SEEN /HPF (ref <=5)
Specific Gravity, Urine: 1.019 (ref 1.001–1.03)
pH: 5.5 (ref 5.0–8.0)

## 2017-09-14 LAB — CBC
HCT: 42.7 % (ref 35.0–45.0)
HEMOGLOBIN: 14.3 g/dL (ref 11.7–15.5)
MCH: 30.4 pg (ref 27.0–33.0)
MCHC: 33.5 g/dL (ref 32.0–36.0)
MCV: 90.9 fL (ref 80.0–100.0)
MPV: 10.4 fL (ref 7.5–12.5)
Platelets: 213 10*3/uL (ref 140–400)
RBC: 4.7 10*6/uL (ref 3.80–5.10)
RDW: 12.7 % (ref 11.0–15.0)
WBC: 7 10*3/uL (ref 3.8–10.8)

## 2017-09-14 LAB — HEMOGLOBIN A1C
HEMOGLOBIN A1C: 6.3 %{Hb} — AB (ref <5.7)
Mean Plasma Glucose: 134 (calc)
eAG (mmol/L): 7.4 (calc)

## 2017-09-14 LAB — TSH: TSH: 0.9 m[IU]/L (ref 0.40–4.50)

## 2017-09-14 LAB — BRAIN NATRIURETIC PEPTIDE: BRAIN NATRIURETIC PEPTIDE: 23 pg/mL (ref <100)

## 2017-09-14 LAB — LIPID PANEL
CHOL/HDL RATIO: 4 (calc) (ref <5.0)
CHOLESTEROL: 200 mg/dL — AB (ref <200)
HDL: 50 mg/dL — ABNORMAL LOW (ref >50)
LDL CHOLESTEROL (CALC): 129 mg/dL — AB
Non-HDL Cholesterol (Calc): 150 mg/dL (calc) — ABNORMAL HIGH (ref <130)
Triglycerides: 100 mg/dL (ref <150)

## 2017-09-14 MED ORDER — CIPROFLOXACIN HCL 500 MG PO TABS: 500.0000 mg | ORAL_TABLET | Freq: Two times a day (BID) | ORAL | 0 refills | Status: DC

## 2017-09-21 DIAGNOSIS — R8290 Unspecified abnormal findings in urine: Secondary | ICD-10-CM | POA: Diagnosis not present

## 2017-09-23 LAB — URINE CULTURE
MICRO NUMBER:: 90717831
SPECIMEN QUALITY: ADEQUATE

## 2017-10-31 ENCOUNTER — Other Ambulatory Visit: Payer: Self-pay | Admitting: Osteopathic Medicine

## 2017-11-05 ENCOUNTER — Other Ambulatory Visit: Payer: Self-pay | Admitting: Osteopathic Medicine

## 2017-11-06 ENCOUNTER — Telehealth: Payer: Self-pay

## 2017-11-06 DIAGNOSIS — I10 Essential (primary) hypertension: Secondary | ICD-10-CM

## 2017-11-06 MED ORDER — LOSARTAN POTASSIUM 50 MG PO TABS: 50.0000 mg | ORAL_TABLET | Freq: Every day | ORAL | 1 refills | Status: DC

## 2017-11-06 NOTE — Telephone Encounter (Signed)
As per [facility] pharmacist irbersartan rx is on back order indefinitely. Pharmacist requesting medication to be switched. Pls advise, thanks.

## 2017-11-06 NOTE — Telephone Encounter (Signed)
edit alternative sent, not tentative sent

## 2017-11-06 NOTE — Telephone Encounter (Signed)
Tentative sent.  If this ones also on back order, pharmacy will have to tell me which [facility] medications they have in stock

## 2017-12-05 DIAGNOSIS — F0390 Unspecified dementia without behavioral disturbance: Secondary | ICD-10-CM | POA: Diagnosis not present

## 2017-12-05 DIAGNOSIS — J80 Acute respiratory distress syndrome: Secondary | ICD-10-CM | POA: Diagnosis not present

## 2017-12-05 DIAGNOSIS — R410 Disorientation, unspecified: Secondary | ICD-10-CM | POA: Diagnosis not present

## 2017-12-05 DIAGNOSIS — R4182 Altered mental status, unspecified: Secondary | ICD-10-CM | POA: Diagnosis not present

## 2017-12-05 DIAGNOSIS — R531 Weakness: Secondary | ICD-10-CM | POA: Diagnosis not present

## 2017-12-05 DIAGNOSIS — E785 Hyperlipidemia, unspecified: Secondary | ICD-10-CM | POA: Diagnosis not present

## 2017-12-05 DIAGNOSIS — Z79899 Other long term (current) drug therapy: Secondary | ICD-10-CM | POA: Diagnosis not present

## 2017-12-05 DIAGNOSIS — I10 Essential (primary) hypertension: Secondary | ICD-10-CM | POA: Diagnosis not present

## 2017-12-05 DIAGNOSIS — R0902 Hypoxemia: Secondary | ICD-10-CM | POA: Diagnosis not present

## 2017-12-05 DIAGNOSIS — R001 Bradycardia, unspecified: Secondary | ICD-10-CM | POA: Diagnosis not present

## 2017-12-10 ENCOUNTER — Other Ambulatory Visit: Payer: Self-pay | Admitting: Osteopathic Medicine

## 2017-12-14 ENCOUNTER — Ambulatory Visit: Payer: Medicare Other | Admitting: Osteopathic Medicine

## 2017-12-18 ENCOUNTER — Encounter: Payer: Self-pay | Admitting: Osteopathic Medicine

## 2017-12-18 ENCOUNTER — Ambulatory Visit (INDEPENDENT_AMBULATORY_CARE_PROVIDER_SITE_OTHER): Payer: Medicare Other | Admitting: Osteopathic Medicine

## 2017-12-18 VITALS — BP 140/83 | HR 80 | Wt 158.2 lb

## 2017-12-18 DIAGNOSIS — I10 Essential (primary) hypertension: Secondary | ICD-10-CM | POA: Diagnosis not present

## 2017-12-18 DIAGNOSIS — G308 Other Alzheimer's disease: Secondary | ICD-10-CM | POA: Diagnosis not present

## 2017-12-18 DIAGNOSIS — F0281 Dementia in other diseases classified elsewhere with behavioral disturbance: Secondary | ICD-10-CM | POA: Diagnosis not present

## 2017-12-18 NOTE — [place] Notes (Signed)
HPI: Danielle Mcdowell is a 84 y.o. female who  has a past medical history of Alzheimer's disease (11/21/2013), Autoimmune hepatitis ([place]) (11/21/2013), Essential hypertension, benign (11/21/2013), Osteoporosis (11/21/2013), and Type 2 diabetes mellitus ([place]) (11/21/2013).  she presents to [facility] today, 12/18/17,  for chief complaint of:  Dementia   Moods/dementia: More sleeping than usual - daughter is worried about this. Giving 1 tablet remeron qhs.  Moods are ok.    Reviewed records from emergency room visit 12/05/2017: Presented to emergency department for complaint of altered mental status, increased lethargy and confusion.  Vital signs okay, blood pressure 141/76, normal temperature, normal O2 sat.  Chest x-ray was fine.  No concerns on labs including urine.  EKG showed sinus bradycardia to a rate of 49 but then progress note mentions a rate of 66 -I am unable to view tracings as this was done at [facility].   Concerns about new losartan pills dont look the same, pharmacy apperas to have dispensed losartan 100 mg to take 1/2 tablet daily.    Patient is accompanied by daughter who assists with history-taking.   Past medical history, surgical history, and family history reviewed.  Current medication list and allergy/intolerance information reviewed.   (See remainder of HPI, ROS, Phys Exam below)    ASSESSMENT/PLAN:   Alzheimer's disease of other onset with behavioral disturbance - sleeping a lot, daughter is concerned but no other new deficits or worsening confusion or behavioral issues - ok to cut back remeron and monitor - Plan: Basic metabolic panel  Essential hypertension, benign - recheck K+ levels. Losartan is ok but not sure why pharmacy changed pill - would check w/ them - Plan: Basic metabolic panel     Patient Instructions   Hold remeron for a few days and see if this helps sleep / energy levels  Check with the pharmacy about the  losartan - should be on 50 mg daily   Will recheck potassium levels today    Follow-up plan: Return in about 4 months (around 04/19/2018) for recheck dementia and blood pressure - sooner if needed.           ############################################ ############################################ ############################################ ############################################    Outpatient Encounter Medications as of 12/18/2017  Medication Sig  . ciprofloxacin (CIPRO) 500 MG tablet Take 1 tablet (500 mg total) by mouth 2 (two) times daily.  Marland Kitchen losartan (COZAAR) 50 MG tablet Take 1 tablet (50 mg total) by mouth daily.  . mirtazapine (REMERON) 15 MG tablet TAKE 1-2 TABLETS (15-30 MG TOTAL) BY MOUTH AT BEDTIME.  Marland Kitchen ondansetron (ZOFRAN-ODT) 8 MG disintegrating tablet Take 1 tablet (8 mg total) by mouth every 8 (eight) hours as needed for nausea.  . rivastigmine (EXELON) 4.6 mg/24hr PLACE 1 PATCH (4.6 MG TOTAL) ONTO THE SKIN DAILY. TO HELP WITH MEMORY.   No facility-administered encounter medications on file as of 12/18/2017.    Allergies  Allergen Reactions  . Fosamax [Alendronate Sodium]   . Lisinopril       Review of Systems:  Constitutional: No recent illness  HEENT: No  headache, no vision change  Cardiac: No  chest pain  Respiratory:  +shortness of breath, chronic. No  Cough  Gastrointestinal: No  abdominal pain  Musculoskeletal: No new myalgia/arthralgia  Skin: No  Rash  Neurologic: +generalized weakness, No  Dizziness  Exam:  BP 140/83 (BP Location: Left Arm, Patient Position: Sitting, Cuff Size: Normal)   Pulse 80   Wt 158 lb 3.2 oz (71.8 kg)  BMI 26.33 kg/m   Constitutional: VS see above. General Appearance: alert, well-developed, well-nourished, NAD  Eyes: Normal lids and conjunctive, non-icteric sclera  Ears, Nose, Mouth, Throat: MMM, Normal external inspection ears/nares/mouth/lips/gums.  Neck: No masses, trachea midline.    Respiratory: Normal respiratory effort. no wheeze, no rhonchi, no rales  Cardiovascular: S1/S2 normal, no murmur, RRR  Musculoskeletal: Gait normal. Symmetric and independent movement of all extremities  Neurological: Fair balance/coordination.  Skin: warm, dry, intact.   Psychiatric: Poor judgment/insight. Pleasant mood and affect. Oriented x3.   Visit summary with medication list and pertinent instructions was printed for patient to review, advised to alert [place] if any changes needed. All questions at time of visit were answered - patient instructed to contact office with any additional concerns. ER/RTC precautions were reviewed with the patient and understanding verbalized.   Follow-up plan: Return in about 4 months (around 04/19/2018) for recheck dementia and blood pressure - sooner if needed.  Note: Total time spent 25 minutes, greater than 50% of the visit was spent face-to-face counseling and coordinating care for the following: The primary encounter diagnosis was Alzheimer's disease of other onset with behavioral disturbance. A diagnosis of Essential hypertension, benign was also pertinent to this visit.Marland Kitchen  Please note: voice recognition software was used to produce this document, and typos may escape review. Please contact Dr. Sheppard Coil for any needed clarifications.

## 2017-12-18 NOTE — Patient Instructions (Addendum)
   Hold remeron for a few days and see if this helps sleep / energy levels  Check with the pharmacy about the losartan - should be on 50 mg daily   Will recheck potassium levels today

## 2018-02-27 ENCOUNTER — Other Ambulatory Visit: Payer: Self-pay

## 2018-03-04 ENCOUNTER — Other Ambulatory Visit: Payer: Self-pay | Admitting: Osteopathic Medicine

## 2018-04-19 ENCOUNTER — Telehealth: Payer: Self-pay | Admitting: Osteopathic Medicine

## 2018-04-19 ENCOUNTER — Ambulatory Visit: Payer: Medicare Other | Admitting: Osteopathic Medicine

## 2018-04-19 NOTE — Telephone Encounter (Signed)
Noted. Thanks for the update.  

## 2018-04-19 NOTE — Telephone Encounter (Signed)
Patient left a voicemail stating that she has the flu and can not make her appointment. Did not state a reschedule at this time.

## 2018-05-21 ENCOUNTER — Encounter: Payer: Self-pay | Admitting: Osteopathic Medicine

## 2018-05-21 ENCOUNTER — Ambulatory Visit (INDEPENDENT_AMBULATORY_CARE_PROVIDER_SITE_OTHER): Payer: Medicare Other | Admitting: Osteopathic Medicine

## 2018-05-21 VITALS — BP 139/83 | HR 88 | Temp 97.5°F | Wt 155.7 lb

## 2018-05-21 DIAGNOSIS — G308 Other Alzheimer's disease: Secondary | ICD-10-CM | POA: Diagnosis not present

## 2018-05-21 DIAGNOSIS — F0281 Dementia in other diseases classified elsewhere with behavioral disturbance: Secondary | ICD-10-CM | POA: Diagnosis not present

## 2018-05-21 DIAGNOSIS — R82998 Other abnormal findings in urine: Secondary | ICD-10-CM | POA: Diagnosis not present

## 2018-05-21 DIAGNOSIS — R410 Disorientation, unspecified: Secondary | ICD-10-CM | POA: Diagnosis not present

## 2018-05-21 LAB — POCT URINALYSIS DIPSTICK
Bilirubin, UA: NEGATIVE
Blood, UA: NEGATIVE
Glucose, UA: NEGATIVE
NITRITE UA: NEGATIVE
PROTEIN UA: POSITIVE — AB
Urobilinogen, UA: 0.2 E.U./dL
pH, UA: 5.5 (ref 5.0–8.0)

## 2018-05-21 MED ORDER — CEPHALEXIN 500 MG PO CAPS: 500.0000 mg | ORAL_CAPSULE | Freq: Four times a day (QID) | ORAL | 0 refills | Status: AC

## 2018-05-21 NOTE — Progress Notes (Signed)
HPI: Danielle Mcdowell is a 85 y.o. female who  has a past medical history of Alzheimer's disease ([place]) (11/21/2013), Autoimmune hepatitis ([place]) (11/21/2013), Essential hypertension, benign (11/21/2013), Osteoporosis (11/21/2013), and Type 2 diabetes mellitus ([place]) (11/21/2013).  she presents to [facility] today, 05/22/18,  for chief complaint of:  Dementia, insomnia, confusion   . Context: history of dementia, lives w/ daughter. Was alone today, daughter got call from neighbors about patient wandering outside, knocking on neighbors' doors. Seems more confused today . Quality: confusion, wandering  . Duration: today  . Assoc signs/symptoms: no fever or recent illness, no complaints of pain, no diarrhea or urinary problems, no cough.   Patient is accompanied by daughter who assists with history-taking.    At today's visit 05/22/18 ... PMH, PSH, FH reviewed and updated as needed.  Current medication list and allergy/intolerance hx reviewed and updated as needed. (See remainder of HPI, ROS, Phys Exam below)      Results for orders placed or performed in visit on 05/21/18 (from the past 72 hour(s))  POCT Urinalysis Dipstick     Status: Abnormal   Collection Time: 05/21/18  3:43 PM  Result Value Ref Range   Color, UA amber    Clarity, UA slightly cloudy    Glucose, UA Negative Negative   Bilirubin, UA negative    Ketones, UA trace    Spec Grav, UA >=1.030 (A) 1.010 - 1.025   Blood, UA negative    pH, UA 5.5 5.0 - 8.0   Protein, UA Positive (A) Negative    Comment: Trace   Urobilinogen, UA 0.2 0.2 or 1.0 E.U./dL   Nitrite, UA negative    Leukocytes, UA Small (1+) (A) Negative   Appearance     Odor    Urinalysis, microscopic only     Status: Abnormal   Collection Time: 05/21/18  3:43 PM  Result Value Ref Range   WBC, UA 10-20 (A) 0 - 5 /HPF   RBC / HPF 0-2 0 - 2 /HPF   Squamous Epithelial / LPF 0-5 < OR = 5 /HPF   Bacteria, UA NONE SEEN NONE  SEEN /HPF   Calcium Oxalate Crystal MODERATE (A) NONE OR FE /HPF   Hyaline Cast NONE SEEN NONE SEEN /LPF  CBC with Differential/Platelet     Status: None   Collection Time: 05/21/18  3:45 PM  Result Value Ref Range   WBC 9.4 3.8 - 10.8 Thousand/uL   RBC 4.44 3.80 - 5.10 Million/uL   Hemoglobin 13.5 11.7 - 15.5 g/dL   HCT 40.6 35.0 - 45.0 %   MCV 91.4 80.0 - 100.0 fL   MCH 30.4 27.0 - 33.0 pg   MCHC 33.3 32.0 - 36.0 g/dL   RDW 12.3 11.0 - 15.0 %   Platelets 223 140 - 400 Thousand/uL   MPV 10.3 7.5 - 12.5 fL   Neutro Abs 6,364 1,500 - 7,800 cells/uL   Lymphs Abs 2,124 850 - 3,900 cells/uL   Absolute Monocytes 724 200 - 950 cells/uL   Eosinophils Absolute 132 15 - 500 cells/uL   Basophils Absolute 56 0 - 200 cells/uL   Neutrophils Relative % 67.7 %   Total Lymphocyte 22.6 %   Monocytes Relative 7.7 %   Eosinophils Relative 1.4 %   Basophils Relative 0.6 %  COMPLETE METABOLIC PANEL WITH GFR     Status: Abnormal   Collection Time: 05/21/18  3:45 PM  Result Value Ref Range   Glucose, Bld 120 (  H) 65 - 99 mg/dL    Comment: .            Fasting reference interval . For someone without known diabetes, a glucose value between 100 and 125 mg/dL is consistent with prediabetes and should be confirmed with a follow-up test. .    BUN 16 7 - 25 mg/dL   Creat 1.10 (H) 0.60 - 0.88 mg/dL    Comment: For patients >32 years of age, the reference limit for Creatinine is approximately 13% higher for people identified as African-American. .    GFR, Est Non African American 46 (L) > OR = 60 mL/min/1.37m2   GFR, Est African American 53 (L) > OR = 60 mL/min/1.9m2   BUN/Creatinine Ratio 15 6 - 22 (calc)   Sodium 142 135 - 146 mmol/L   Potassium 3.9 3.5 - 5.3 mmol/L   Chloride 106 98 - 110 mmol/L   CO2 25 20 - 32 mmol/L   Calcium 10.0 8.6 - 10.4 mg/dL   Total Protein 7.4 6.1 - 8.1 g/dL   Albumin 4.4 3.6 - 5.1 g/dL   Globulin 3.0 1.9 - 3.7 g/dL (calc)   AG Ratio 1.5 1.0 - 2.5 (calc)    Total Bilirubin 0.4 0.2 - 1.2 mg/dL   Alkaline phosphatase (APISO) 75 37 - 153 U/L   AST 12 10 - 35 U/L   ALT 8 6 - 29 U/L  TSH     Status: None   Collection Time: 05/21/18  3:45 PM  Result Value Ref Range   TSH 1.44 0.40 - 4.50 mIU/L            ASSESSMENT/PLAN: The primary encounter diagnosis was Confusion. Diagnoses of Alzheimer's disease of other onset with behavioral disturbance ([place]) and Urine WBC increased were also pertinent to this visit.   Possible delirium on top of dementia, vs worsening dementia. Evaluate for infection. Advise can restart the Exelon patches but this might no tmake much difference.   Orders Placed This Encounter  Procedures  . Urine Culture  . CBC with Differential/Platelet  . COMPLETE METABOLIC PANEL WITH GFR  . TSH  . Urinalysis, microscopic only  . POCT Urinalysis Dipstick     Meds ordered this encounter  Medications  . cephALEXin (KEFLEX) 500 MG capsule    Sig: Take 1 capsule (500 mg total) by mouth 4 (four) times daily for 7 days.    Dispense:  28 capsule    Refill:  0    Patient Instructions  Urine is showing some white blood cells, possible infection. This may cause increased confusion.   We are sending urine for culture to confirm infection, but can start antibiotics in the meantime. Culture results should be available in 2-3 days. We may need to change antibiotics, or we may stop antibiotics if there is no confirmed infection.   I'd like to get some labs to double check a few other things.   This behavior may also be progression of dementia. If dangerous behavior continues or worsens, it may be time to think about arrangement for supervised care in the home or other facility.        Follow-up plan: Return if symptoms worsen or fail to improve, and depending on results  .                                                 ################################################# ################################################# ################################################# #################################################  Current Meds  Medication Sig  . losartan (COZAAR) 50 MG tablet Take 1 tablet (50 mg total) by mouth daily.  . mirtazapine (REMERON) 15 MG tablet TAKE 1-2 TABLETS (15-30 MG TOTAL) BY MOUTH AT BEDTIME.    Allergies  Allergen Reactions  . Fosamax [Alendronate Sodium]   . Lisinopril        Review of Systems: Patient is noncontributory, she denies any pain, states she feels pretty good today.  As obtained from daughter:  CONSTITUTIONAL: no fever, no weight changes  HEAD/EYES/EARS/NOSE: no nasal congestion  RESPIRATORY: no cough, no wheezing or shortness of breath  GASTROINTESTINAL: Normal appetite. no vomiting. no diarrhea. no constipation. no blood in the stool.  MUSCULOSKELETAL: no apparent joint pain. Moving normally. no joint swelling.  SKIN: no rash/wounds/concerning lesions  HEM/ONC: no easy bruising/bleeding, no abnormal lymph node  PSYCHIATRIC: +concerns with behavioral issues   Exam:  BP 139/83 (BP Location: Left Arm, Patient Position: Sitting, Cuff Size: Normal)   Pulse 88   Temp (!) 97.5 F (36.4 C) (Oral)   Wt 155 lb 11.2 oz (70.6 kg)   BMI 25.91 kg/m   Constitutional: VS see above. General Appearance: alert, well-developed, well-nourished, NAD  Eyes: Normal lids and conjunctive, non-icteric sclera  Ears, Nose, Mouth, Throat: MMM, Normal external inspection ears/nares/mouth/lips/gums.  Neck: No masses, trachea midline.   Respiratory: Normal respiratory effort. no wheeze, no rhonchi, no rales  Cardiovascular: S1/S2 normal, no murmur, no rub/gallop auscultated. RRR.   Musculoskeletal: Gait normal. Symmetric and independent movement of all  extremities  Neurological: Normal balance/coordination. No tremor.  Skin: warm, dry, intact.   Psychiatric: Normal judgment/insight. Normal mood and affect. Oriented x3.       Visit summary with medication list and pertinent instructions was printed for patient to review, patient was advised to alert [place] if any updates are needed. All questions at time of visit were answered - patient instructed to contact office with any additional concerns. ER/RTC precautions were reviewed with the patient and understanding verbalized.   Note: Total time spent 25 minutes, greater than 50% of the visit was spent face-to-face counseling and coordinating care for the following: The primary encounter diagnosis was Confusion. Diagnoses of Alzheimer's disease of other onset with behavioral disturbance ([place]) and Urine WBC increased were also pertinent to this visit.Marland Kitchen  Please note: voice recognition software was used to produce this document, and typos may escape review. Please contact Dr. Sheppard Coil for any needed clarifications.    Follow up plan: Return if symptoms worsen or fail to improve, and depending on results .

## 2018-05-21 NOTE — Patient Instructions (Signed)
Urine is showing some white blood cells, possible infection. This may cause increased confusion.   We are sending urine for culture to confirm infection, but can start antibiotics in the meantime. Culture results should be available in 2-3 days. We may need to change antibiotics, or we may stop antibiotics if there is no confirmed infection.   I'd like to get some labs to double check a few other things.   This behavior may also be progression of dementia. If dangerous behavior continues or worsens, it may be time to think about arrangement for supervised care in the home or other facility.

## 2018-05-22 ENCOUNTER — Encounter: Payer: Self-pay | Admitting: Osteopathic Medicine

## 2018-05-22 LAB — COMPLETE METABOLIC PANEL WITH GFR
AG Ratio: 1.5 (calc) (ref 1.0–2.5)
ALKALINE PHOSPHATASE (APISO): 75 U/L (ref 37–153)
ALT: 8 U/L (ref 6–29)
AST: 12 U/L (ref 10–35)
Albumin: 4.4 g/dL (ref 3.6–5.1)
BUN / CREAT RATIO: 15 (calc) (ref 6–22)
BUN: 16 mg/dL (ref 7–25)
CALCIUM: 10 mg/dL (ref 8.6–10.4)
CO2: 25 mmol/L (ref 20–32)
CREATININE: 1.1 mg/dL — AB (ref 0.60–0.88)
Chloride: 106 mmol/L (ref 98–110)
GFR, EST NON AFRICAN AMERICAN: 46 mL/min/{1.73_m2} — AB (ref >=60)
GFR, Est African American: 53 mL/min/{1.73_m2} — ABNORMAL LOW (ref >=60)
GLUCOSE: 120 mg/dL — AB (ref 65–99)
Globulin: 3 g/dL (calc) (ref 1.9–3.7)
Potassium: 3.9 mmol/L (ref 3.5–5.3)
Sodium: 142 mmol/L (ref 135–146)
Total Bilirubin: 0.4 mg/dL (ref 0.2–1.2)
Total Protein: 7.4 g/dL (ref 6.1–8.1)

## 2018-05-22 LAB — URINALYSIS, MICROSCOPIC ONLY
Bacteria, UA: NONE SEEN /HPF
Hyaline Cast: NONE SEEN /LPF

## 2018-05-22 LAB — CBC WITH DIFFERENTIAL/PLATELET
Absolute Monocytes: 724 cells/uL (ref 200–950)
BASOS PCT: 0.6 %
Basophils Absolute: 56 cells/uL (ref 0–200)
EOS ABS: 132 {cells}/uL (ref 15–500)
EOS PCT: 1.4 %
HCT: 40.6 % (ref 35.0–45.0)
Hemoglobin: 13.5 g/dL (ref 11.7–15.5)
Lymphs Abs: 2124 cells/uL (ref 850–3900)
MCH: 30.4 pg (ref 27.0–33.0)
MCHC: 33.3 g/dL (ref 32.0–36.0)
MCV: 91.4 fL (ref 80.0–100.0)
MONOS PCT: 7.7 %
MPV: 10.3 fL (ref 7.5–12.5)
NEUTROS ABS: 6364 {cells}/uL (ref 1500–7800)
Neutrophils Relative %: 67.7 %
PLATELETS: 223 10*3/uL (ref 140–400)
RBC: 4.44 10*6/uL (ref 3.80–5.10)
RDW: 12.3 % (ref 11.0–15.0)
TOTAL LYMPHOCYTE: 22.6 %
WBC: 9.4 10*3/uL (ref 3.8–10.8)

## 2018-05-22 LAB — URINE CULTURE
MICRO NUMBER: 180312
RESULT: NO GROWTH
SPECIMEN QUALITY:: ADEQUATE

## 2018-05-22 LAB — TSH: TSH: 1.44 m[IU]/L (ref 0.40–4.50)

## 2018-06-08 ENCOUNTER — Other Ambulatory Visit: Payer: Self-pay | Admitting: Osteopathic Medicine

## 2018-06-09 ENCOUNTER — Other Ambulatory Visit: Payer: Self-pay | Admitting: Osteopathic Medicine

## 2018-06-09 DIAGNOSIS — I10 Essential (primary) hypertension: Secondary | ICD-10-CM

## 2018-08-03 IMAGING — DX DG CHEST 2V
2 series · 2 of 2 positions shown · non-contrast
Comparison: None.

CLINICAL DATA: Shortness of breath.

EXAM:
CHEST - 2 VIEW

[chest pa]
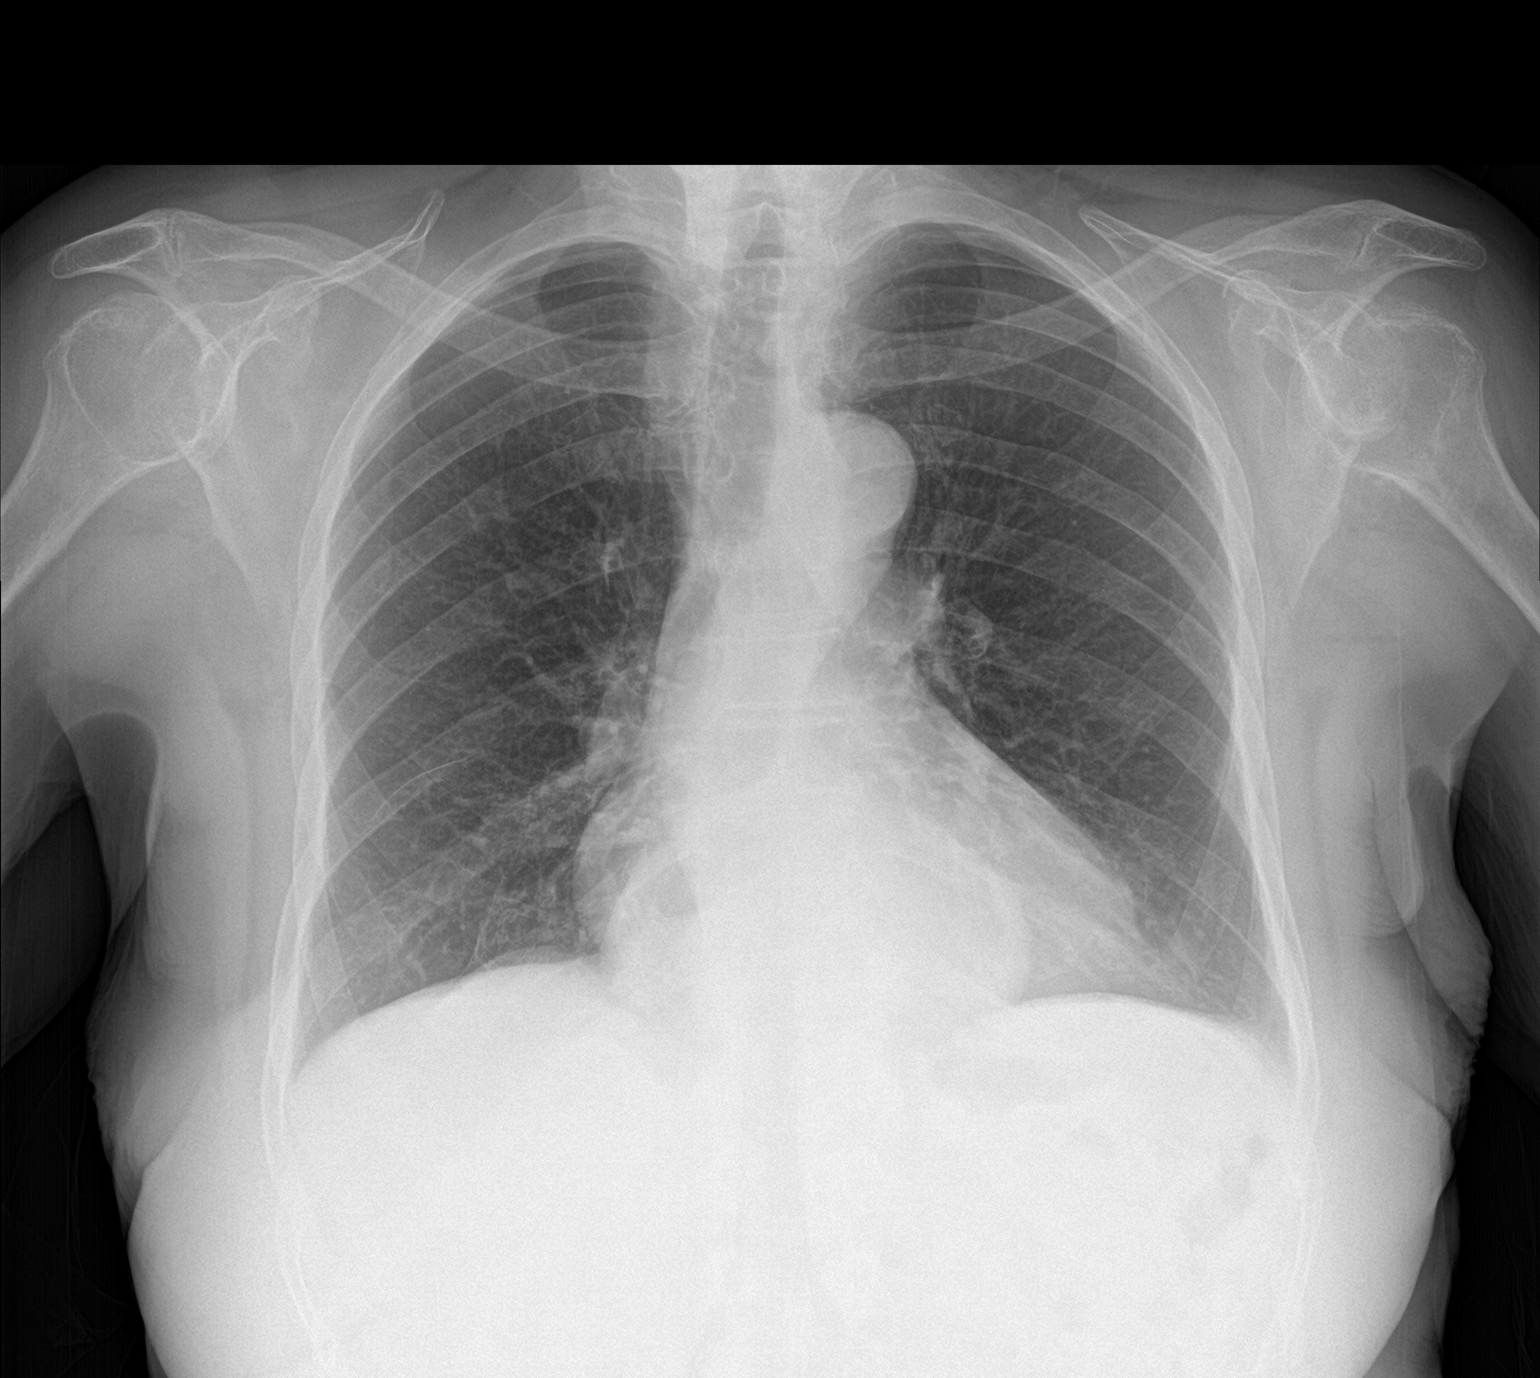

[chest lat]
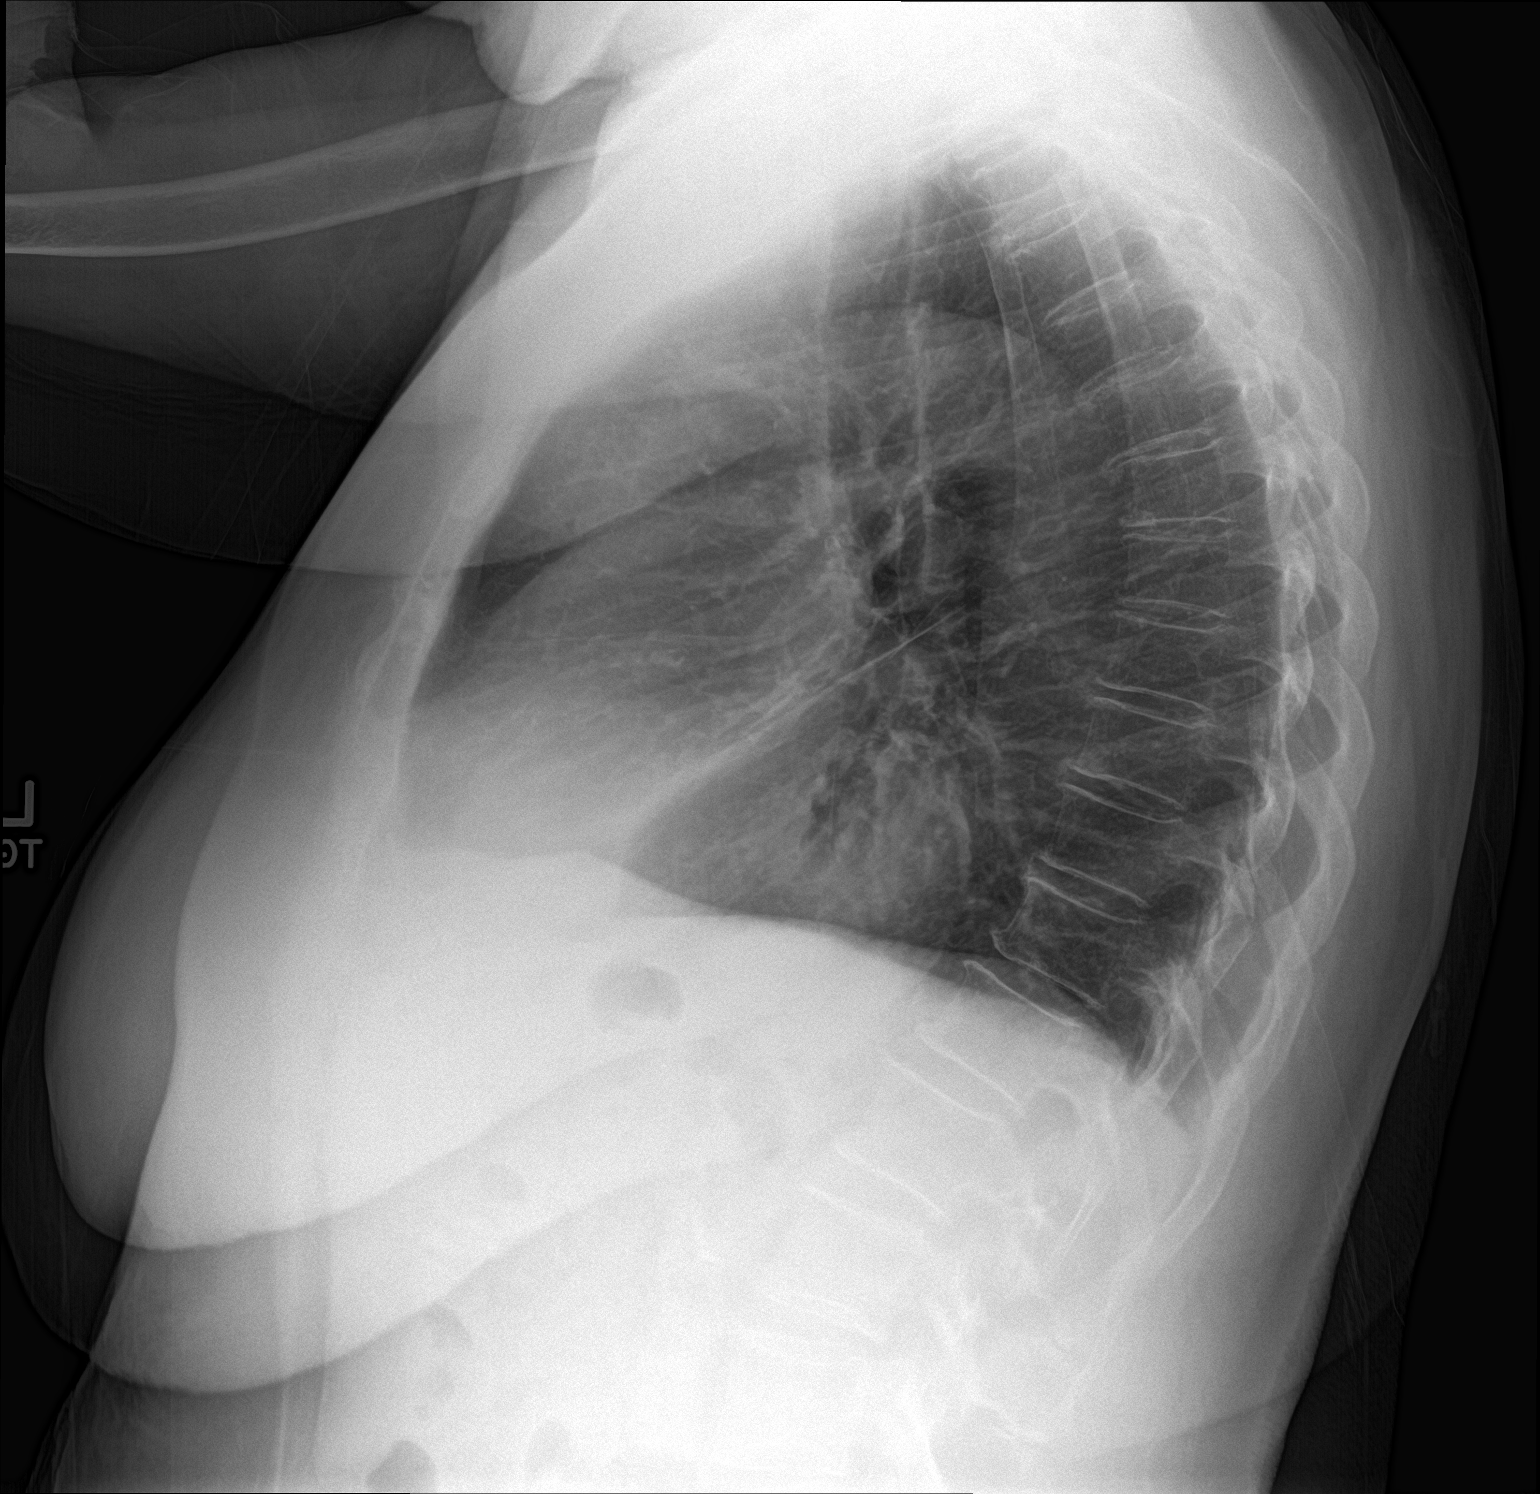

[2 of 2 positions shown; findings below may reference images not displayed]

FINDINGS: The heart size and mediastinal contours are within normal limits.
Both lungs are clear. No pneumothorax or pleural effusion is noted.
Moderate size hiatal hernia is noted. The visualized skeletal
structures are unremarkable.
IMPRESSION: No active cardiopulmonary disease.  Moderate size hiatal hernia.

## 2019-01-28 ENCOUNTER — Other Ambulatory Visit: Payer: Self-pay | Admitting: Osteopathic Medicine

## 2019-01-28 DIAGNOSIS — I10 Essential (primary) hypertension: Secondary | ICD-10-CM

## 2019-01-28 NOTE — Telephone Encounter (Signed)
Requested medication (s) are due for refill today: yes  Requested medication (s) are on the active medication list: yes  Last refill:  11/05/2018  Future visit scheduled: no  Notes to clinic:  Review for refill Overdue for office visit   Requested Prescriptions  Pending Prescriptions Disp Refills   losartan (COZAAR) 100 MG tablet [Pharmacy Med Name: LOSARTAN POTASSIUM 100 MG TAB] 45 tablet 0    Sig: TAKE 1/2 TABLET (50MG  TOTAL) BY MOUTH DAILY     Cardiovascular:  Angiotensin Receptor Blockers Failed - 01/28/2019  1:04 AM      Failed - Cr in normal range and within 180 days    Creat  Date Value Ref Range Status  05/21/2018 1.10 (H) 0.60 - 0.88 mg/dL Final    Comment:    For patients >10 years of age, the reference limit for Creatinine is approximately 13% higher for people identified as African-American. .          Failed - K in normal range and within 180 days    Potassium  Date Value Ref Range Status  05/21/2018 3.9 3.5 - 5.3 mmol/L Final         Failed - Valid encounter within last 6 months    Recent Outpatient Visits          8 months ago Confusion   [place], Lanelle Bal, DO   1 year ago Alzheimer's disease of other onset with behavioral disturbance   [facility], Lanelle Bal, DO   1 year ago Alzheimer's disease of other onset with behavioral disturbance   [facility], Lanelle Bal, DO   1 year ago Alzheimer's disease of other onset with behavioral disturbance   [facility], Lanelle Bal, DO   1 year ago Alzheimer's disease of other onset with behavioral disturbance   [facility], Lanelle Bal, DO             Passed - Patient is not pregnant      Passed - Last BP in normal range    BP Readings from Last 1 Encounters:  05/21/18 139/83

## 2019-01-30 ENCOUNTER — Other Ambulatory Visit: Payer: Self-pay

## 2019-01-30 ENCOUNTER — Other Ambulatory Visit: Payer: Self-pay | Admitting: Osteopathic Medicine

## 2019-01-30 DIAGNOSIS — I10 Essential (primary) hypertension: Secondary | ICD-10-CM

## 2019-01-30 MED ORDER — MIRTAZAPINE 15 MG PO TABS: 15.0000 mg | ORAL_TABLET | Freq: Every day | ORAL | 1 refills | Status: DC

## 2019-02-07 ENCOUNTER — Other Ambulatory Visit: Payer: Self-pay

## 2019-02-07 DIAGNOSIS — I10 Essential (primary) hypertension: Secondary | ICD-10-CM

## 2019-02-07 MED ORDER — LOSARTAN POTASSIUM 100 MG PO TABS: ORAL_TABLET | ORAL | 0 refills | Status: DC

## 2019-05-02 ENCOUNTER — Other Ambulatory Visit: Payer: Self-pay | Admitting: Osteopathic Medicine

## 2019-05-02 NOTE — Telephone Encounter (Signed)
Forwarding medication refill request to the clinical pool for review. 

## 2019-07-11 ENCOUNTER — Other Ambulatory Visit: Payer: Self-pay | Admitting: Osteopathic Medicine

## 2019-07-11 DIAGNOSIS — I10 Essential (primary) hypertension: Secondary | ICD-10-CM

## 2019-10-24 ENCOUNTER — Other Ambulatory Visit: Payer: Self-pay | Admitting: Osteopathic Medicine

## 2019-10-24 DIAGNOSIS — I10 Essential (primary) hypertension: Secondary | ICD-10-CM

## 2019-11-15 ENCOUNTER — Other Ambulatory Visit: Payer: Self-pay | Admitting: Family Medicine

## 2019-11-15 DIAGNOSIS — I10 Essential (primary) hypertension: Secondary | ICD-10-CM

## 2019-12-05 ENCOUNTER — Other Ambulatory Visit: Payer: Self-pay | Admitting: Family Medicine

## 2019-12-05 DIAGNOSIS — I10 Essential (primary) hypertension: Secondary | ICD-10-CM

## 2020-01-01 ENCOUNTER — Other Ambulatory Visit: Payer: Self-pay | Admitting: Osteopathic Medicine

## 2020-01-01 DIAGNOSIS — I10 Essential (primary) hypertension: Secondary | ICD-10-CM

## 2020-01-26 ENCOUNTER — Other Ambulatory Visit: Payer: Self-pay | Admitting: Osteopathic Medicine

## 2020-01-26 DIAGNOSIS — I10 Essential (primary) hypertension: Secondary | ICD-10-CM

## 2020-02-02 ENCOUNTER — Other Ambulatory Visit: Payer: Self-pay | Admitting: Osteopathic Medicine

## 2020-02-02 DIAGNOSIS — I10 Essential (primary) hypertension: Secondary | ICD-10-CM

## 2020-02-16 ENCOUNTER — Ambulatory Visit (INDEPENDENT_AMBULATORY_CARE_PROVIDER_SITE_OTHER): Payer: Medicare Other | Admitting: Osteopathic Medicine

## 2020-02-16 ENCOUNTER — Other Ambulatory Visit: Payer: Self-pay

## 2020-02-16 ENCOUNTER — Encounter: Payer: Self-pay | Admitting: Osteopathic Medicine

## 2020-02-16 VITALS — BP 127/83 | HR 89 | Temp 98.3°F | Wt 145.1 lb

## 2020-02-16 DIAGNOSIS — E1121 Type 2 diabetes mellitus with diabetic nephropathy: Secondary | ICD-10-CM | POA: Diagnosis not present

## 2020-02-16 DIAGNOSIS — F0280 Dementia in other diseases classified elsewhere without behavioral disturbance: Secondary | ICD-10-CM

## 2020-02-16 DIAGNOSIS — N1832 Chronic kidney disease, stage 3b: Secondary | ICD-10-CM | POA: Diagnosis not present

## 2020-02-16 DIAGNOSIS — Z23 Encounter for immunization: Secondary | ICD-10-CM

## 2020-02-16 DIAGNOSIS — E782 Mixed hyperlipidemia: Secondary | ICD-10-CM

## 2020-02-16 DIAGNOSIS — I10 Essential (primary) hypertension: Secondary | ICD-10-CM

## 2020-02-16 DIAGNOSIS — G309 Alzheimer's disease, unspecified: Secondary | ICD-10-CM | POA: Diagnosis not present

## 2020-02-16 LAB — POCT GLYCOSYLATED HEMOGLOBIN (HGB A1C): Hemoglobin A1C: 6.3 % — AB (ref 4.0–5.6)

## 2020-02-16 MED ORDER — MIRTAZAPINE 15 MG PO TABS
15.0000 mg | ORAL_TABLET | Freq: Every day | ORAL | 3 refills | Status: DC
Start: 2020-02-16 — End: 2021-02-23

## 2020-02-16 MED ORDER — LOSARTAN POTASSIUM 50 MG PO TABS
50.0000 mg | ORAL_TABLET | Freq: Every day | ORAL | 3 refills | Status: DC
Start: 2020-02-16 — End: 2021-02-03

## 2020-02-16 NOTE — Progress Notes (Signed)
Danielle Mcdowell is a 86 y.o. female who presents to  [place] [facility]  today, 02/16/20, seeking care for the following:  . Check-up: see below  . Daughter accompanies patient, no major concerns. Known dementia, stable. Occasional loose stool but nothing chronic or severe.      ASSESSMENT & PLAN with other pertinent findings:  The primary encounter diagnosis was Controlled type 2 diabetes mellitus with diabetic nephropathy, without long-term current use of insulin ([facility]). Diagnoses of Need for influenza vaccination, Essential hypertension, benign, Mixed hyperlipidemia, and Alzheimer's disease ([place]) were also pertinent to this visit.   Results for orders placed or performed in visit on 02/16/20 (from the past 24 hour(s))  POCT HgB A1C     Status: Abnormal   Collection Time: 02/16/20  3:35 PM  Result Value Ref Range   Hemoglobin A1C 6.3 (A) 4.0 - 5.6 %   HbA1c POC (<> result, manual entry)     HbA1c, POC (prediabetic range)     HbA1c, POC (controlled diabetic range)       There are no Patient Instructions on file for this visit.  Orders Placed This Encounter  Procedures  . Flu Vaccine QUAD High Dose(Fluad)  . CBC  . COMPLETE METABOLIC PANEL WITH GFR  . Lipid panel  . POCT HgB A1C    Meds ordered this encounter  Medications  . mirtazapine (REMERON) 15 MG tablet    Sig: Take 1 tablet (15 mg total) by mouth at bedtime.    Dispense:  90 tablet    Refill:  3  . losartan (COZAAR) 50 MG tablet    Sig: Take 1 tablet (50 mg total) by mouth daily.    Dispense:  90 tablet    Refill:  3       Follow-up instructions: Return in about 1 year (around 02/15/2021) for ANNUAL CHECK-UP - SEE [place] SOONER IF NEEDED.                                         BP 127/83 (BP Location: Right Arm, Patient Position: Sitting, Cuff Size: Normal)   Pulse 89   Temp 98.3 F (36.8 C) (Oral)   Wt 145 lb 1.3 oz  (65.8 kg)   BMI 24.14 kg/m   Current Meds  Medication Sig  . losartan (COZAAR) 50 MG tablet Take 1 tablet (50 mg total) by mouth daily.  . mirtazapine (REMERON) 15 MG tablet Take 1 tablet (15 mg total) by mouth at bedtime.  . [DISCONTINUED] losartan (COZAAR) 100 MG tablet TAKE 1/2 TABLET (50MG  TOTAL) BY MOUTH DAILY. Must schedule appt for refills  . [DISCONTINUED] mirtazapine (REMERON) 15 MG tablet TAKE 1 TABLET BY MOUTH EVERYDAY AT BEDTIME    Results for orders placed or performed in visit on 02/16/20 (from the past 72 hour(s))  POCT HgB A1C     Status: Abnormal   Collection Time: 02/16/20  3:35 PM  Result Value Ref Range   Hemoglobin A1C 6.3 (A) 4.0 - 5.6 %   HbA1c POC (<> result, manual entry)     HbA1c, POC (prediabetic range)     HbA1c, POC (controlled diabetic range)      No results found.     All questions at time of visit were answered - patient instructed to contact office with any additional concerns or updates.  ER/RTC precautions were reviewed  with the patient as applicable.   Please note: voice recognition software was used to produce this document, and typos may escape review. Please contact Dr. Sheppard Coil for any needed clarifications.

## 2020-02-17 LAB — COMPLETE METABOLIC PANEL WITH GFR
AG Ratio: 1.3 (calc) (ref 1.0–2.5)
ALT: 7 U/L (ref 6–29)
AST: 13 U/L (ref 10–35)
Albumin: 4 g/dL (ref 3.6–5.1)
Alkaline phosphatase (APISO): 87 U/L (ref 37–153)
BUN/Creatinine Ratio: 16 (calc) (ref 6–22)
BUN: 22 mg/dL (ref 7–25)
CO2: 23 mmol/L (ref 20–32)
Calcium: 9.9 mg/dL (ref 8.6–10.4)
Chloride: 106 mmol/L (ref 98–110)
Creat: 1.36 mg/dL — ABNORMAL HIGH (ref 0.60–0.88)
GFR, Est African American: 41 mL/min/{1.73_m2} — ABNORMAL LOW (ref >=60)
GFR, Est Non African American: 35 mL/min/{1.73_m2} — ABNORMAL LOW (ref >=60)
Globulin: 3.2 g/dL (calc) (ref 1.9–3.7)
Glucose, Bld: 105 mg/dL (ref 65–139)
Potassium: 4.4 mmol/L (ref 3.5–5.3)
Sodium: 139 mmol/L (ref 135–146)
Total Bilirubin: 0.5 mg/dL (ref 0.2–1.2)
Total Protein: 7.2 g/dL (ref 6.1–8.1)

## 2020-02-17 LAB — LIPID PANEL
Cholesterol: 172 mg/dL (ref <200)
HDL: 57 mg/dL (ref >=50)
LDL Cholesterol (Calc): 98 mg/dL (calc)
Non-HDL Cholesterol (Calc): 115 mg/dL (calc) (ref <130)
Total CHOL/HDL Ratio: 3 (calc) (ref <5.0)
Triglycerides: 80 mg/dL (ref <150)

## 2020-02-17 LAB — CBC
HCT: 41.9 % (ref 35.0–45.0)
Hemoglobin: 13.9 g/dL (ref 11.7–15.5)
MCH: 30.7 pg (ref 27.0–33.0)
MCHC: 33.2 g/dL (ref 32.0–36.0)
MCV: 92.5 fL (ref 80.0–100.0)
MPV: 9.7 fL (ref 7.5–12.5)
Platelets: 264 10*3/uL (ref 140–400)
RBC: 4.53 10*6/uL (ref 3.80–5.10)
RDW: 12.1 % (ref 11.0–15.0)
WBC: 8.6 10*3/uL (ref 3.8–10.8)

## 2020-02-17 NOTE — Addendum Note (Signed)
Addended by: Maryla Morrow on: 02/17/2020 08:06 AM   Modules accepted: Orders

## 2021-02-03 ENCOUNTER — Other Ambulatory Visit: Payer: Self-pay | Admitting: Osteopathic Medicine

## 2021-02-15 ENCOUNTER — Encounter: Payer: Medicare Other | Admitting: Osteopathic Medicine

## 2021-02-15 ENCOUNTER — Encounter: Payer: Medicare Other | Admitting: Medical-Surgical

## 2021-02-21 ENCOUNTER — Ambulatory Visit (INDEPENDENT_AMBULATORY_CARE_PROVIDER_SITE_OTHER): Payer: Medicare Other | Admitting: Physician Assistant

## 2021-02-21 DIAGNOSIS — Z0000 Encounter for general adult medical examination without abnormal findings: Secondary | ICD-10-CM | POA: Diagnosis not present

## 2021-02-21 NOTE — Patient Instructions (Addendum)
[place] Maintenance Summary and Written Plan of Care  [name] ,  Thank you for allowing me to perform your Medicare Annual Wellness Visit and for your ongoing commitment to your health.   Health Maintenance & Immunization History Health Maintenance  Topic Date Due  . OPHTHALMOLOGY EXAM  02/21/2021 (Originally 03/24/1984)  . FOOT EXAM  02/23/2021 (Originally 03/24/1984)  . COVID-19 Vaccine (1) 03/09/2021 (Originally 09/23/1983)  . HEMOGLOBIN A1C  03/23/2021 (Originally 08/15/2020)  . Zoster Vaccines- Shingrix (1 of 2) 05/24/2021 (Originally 03/24/1984)  . INFLUENZA VACCINE  07/08/2021 (Originally 11/08/2020)  . Pneumonia Vaccine 47+ Years old (2 - PCV) 02/21/2022 (Originally 07/06/2018)  . TETANUS/TDAP  09/12/2026  . DEXA SCAN  Completed  . HPV VACCINES  Aged Out   Immunization History  Administered Date(s) Administered  . Fluad Quad(high Dose 65+) 02/16/2020  . Influenza, High Dose Seasonal PF 12/06/2015, 12/27/2016, 02/23/2018  . Influenza,inj,Quad PF,6+ Mos 02/24/2014, 03/31/2015  . Influenza-Unspecified 11/29/2012  . Pneumococcal Polysaccharide-23 07/05/2017  . Pneumococcal-Unspecified 08/13/2006  . Td 10/16/1995  . Tdap 09/11/2016    These are the patient goals that we discussed:  Goals Addressed              This Visit's Progress   .  Patient Stated (pt-stated)        02/21/2021 AWV Goal: Fall Prevention  Over the next year, patient will decrease their risk for falls by: Using assistive devices, such as a cane or walker, as needed Identifying fall risks within their home and correcting them by: Removing throw rugs Adding handrails to stairs or ramps Removing clutter and keeping a clear pathway throughout the home Increasing light, especially at night Adding shower handles/bars Raising toilet seat Identifying potential personal risk factors for falls: Medication side effects Incontinence/urgency Vestibular  dysfunction Hearing loss Musculoskeletal disorders Neurological disorders Orthostatic hypotension          This is a list of Health Maintenance Items that are overdue or due now: Pneumococcal vaccine  Influenza vaccine Bone densitometry screening Shingrix vaccine Eye exam Foot exam Hemoglobin A1C  Orders/Referrals Placed Today: No orders of the defined types were placed in this encounter.  (Contact our referral department at [phone] if you have not spoken with someone about your referral appointment within the next 5 days)    Follow-up Plan Follow-up with [name], DO as planned Schedule your Shingrix vaccine at the pharmacy.  Pneumonia and flu vaccine can be done in the office along with hemoglobin A1c and foot exam. Please schedule your eye exam. Medicare wellness visit in one year.  Let us know if you change your mind about the bone density.  AVS printed and mailed to the patient.      Health Maintenance, Female Adopting a healthy lifestyle and getting preventive care are important in promoting health and wellness. Ask your health care provider about: The right schedule for you to have regular tests and exams. Things you can do on your own to prevent diseases and keep yourself healthy. What should I know about diet, weight, and exercise? Eat a healthy diet  Eat a diet that includes plenty of vegetables, fruits, low-fat dairy products, and lean protein. Do not eat a lot of foods that are high in solid fats, added sugars, or sodium. Maintain a healthy weight Body mass index (BMI) is used to identify weight problems. It estimates body fat based on height and weight. Your health care provider can help determine your BMI and help  you achieve or maintain a healthy weight. Get regular exercise Get regular exercise. This is one of the most important things you can do for your health. Most adults should: Exercise for at least 150 minutes each week. The  exercise should increase your heart rate and make you sweat (moderate-intensity exercise). Do strengthening exercises at least twice a week. This is in addition to the moderate-intensity exercise. Spend less time sitting. Even light physical activity can be beneficial. Watch cholesterol and blood lipids Have your blood tested for lipids and cholesterol at 85 years of age, then have this test every 5 years. Have your cholesterol levels checked more often if: Your lipid or cholesterol levels are high. You are older than 85 years of age. You are at high risk for heart disease. What should I know about cancer screening? Depending on your health history and family history, you may need to have cancer screening at various ages. This may include screening for: Breast cancer. Cervical cancer. Colorectal cancer. Skin cancer. Lung cancer. What should I know about heart disease, diabetes, and high blood pressure? Blood pressure and heart disease High blood pressure causes heart disease and increases the risk of stroke. This is more likely to develop in people who have high blood pressure readings or are overweight. Have your blood pressure checked: Every 3-5 years if you are 85-85 years of age. Every year if you are 27 years old or older. Diabetes Have regular diabetes screenings. This checks your fasting blood sugar level. Have the screening done: Once every three years after age 85 if you are at a normal weight and have a low risk for diabetes. More often and at a younger age if you are overweight or have a high risk for diabetes. What should I know about preventing infection? Hepatitis B If you have a higher risk for hepatitis B, you should be screened for this virus. Talk with your health care provider to find out if you are at risk for hepatitis B infection. Hepatitis C Testing is recommended for: Everyone born from [birth year] through 1963. Anyone with known risk factors for hepatitis  C. Sexually transmitted infections (STIs) Get screened for STIs, including gonorrhea and chlamydia, if: You are sexually active and are younger than 85 years of age. You are older than 85 years of age and your health care provider tells you that you are at risk for this type of infection. Your sexual activity has changed since you were last screened, and you are at increased risk for chlamydia or gonorrhea. Ask your health care provider if you are at risk. Ask your health care provider about whether you are at high risk for HIV. Your health care provider may recommend a prescription medicine to help prevent HIV infection. If you choose to take medicine to prevent HIV, you should first get tested for HIV. You should then be tested every 3 months for as long as you are taking the medicine. Pregnancy If you are about to stop having your period (premenopausal) and you may become pregnant, seek counseling before you get pregnant. Take 400 to 800 micrograms (mcg) of folic acid every day if you become pregnant. Ask for birth control (contraception) if you want to prevent pregnancy. Osteoporosis and menopause Osteoporosis is a disease in which the bones lose minerals and strength with aging. This can result in bone fractures. If you are 69 years old or older, or if you are at risk for osteoporosis and fractures, ask your health care provider  if you should: Be screened for bone loss. Take a calcium or vitamin D supplement to lower your risk of fractures. Be given hormone replacement therapy (HRT) to treat symptoms of menopause. Follow these instructions at home: Alcohol use Do not drink alcohol if: Your health care provider tells you not to drink. You are pregnant, may be pregnant, or are planning to become pregnant. If you drink alcohol: Limit how much you have to: 0-1 drink a day. Know how much alcohol is in your drink. In the [place] one drink equals one 12 oz bottle of beer (355 mL), one 5 oz glass  of wine (148 mL), or one 1 oz glass of hard liquor (44 mL). Lifestyle Do not use any products that contain nicotine or tobacco. These products include cigarettes, chewing tobacco, and vaping devices, such as e-cigarettes. If you need help quitting, ask your health care provider. Do not use street drugs. Do not share needles. Ask your health care provider for help if you need support or information about quitting drugs. General instructions Schedule regular health, dental, and eye exams. Stay current with your vaccines. Tell your health care provider if: You often feel depressed. You have ever been abused or do not feel safe at home. Summary Adopting a healthy lifestyle and getting preventive care are important in promoting health and wellness. Follow your health care provider's instructions about healthy diet, exercising, and getting tested or screened for diseases. Follow your health care provider's instructions on monitoring your cholesterol and blood pressure. This information is not intended to replace advice given to you by your health care provider. Make sure you discuss any questions you have with your health care provider. Document Revised: 08/16/2020 Document Reviewed: 08/16/2020 Elsevier Patient Education  [place].

## 2021-02-21 NOTE — Progress Notes (Signed)
MEDICARE ANNUAL WELLNESS VISIT  02/21/2021  Telephone Visit Disclaimer This Medicare AWV was conducted by telephone due to national recommendations for restrictions regarding the COVID-19 Pandemic (e.g. social distancing).  I verified, using two identifiers, that I am speaking with Danielle Mcdowell or their authorized healthcare agent. I discussed the limitations, risks, security, and privacy concerns of performing an evaluation and management service by telephone and the potential availability of an in-person appointment in the future. The patient expressed understanding and agreed to proceed.  Location of Patient: Home with her daughter, Danielle Mcdowell. Location of Provider (nurse):  In the office.  Subjective:    Danielle Mcdowell is a 87 y.o. female patient of Danielle Reeve, DO who had a Medicare Annual Wellness Visit today via telephone. [name] is Retired and lives with their daughter. she has 4 children, one has deceased. she reports that she is socially active and does interact with friends/family regularly. she is minimally physically active and enjoys spending time with her family.  Patient Care Team: Danielle Reeve, DO as PCP - General (Osteopathic Medicine) Danielle Reeve, DO (Osteopathic Medicine)  Advanced Directives 02/21/2021  Does Patient Have a Medical Advance Directive? Yes  Type of Advance Directive Living will  Does patient want to make changes to medical advance directive? No - Patient declined    Hospital Utilization Over the Past 12 Months: # of hospitalizations or ER visits: 0 # of surgeries: 0  Review of Systems    Patient reports that her overall health is worse compared to last year.  History obtained from chart review and unobtainable from patient due to mental status Her daughter, Danielle Mcdowell, assisted with the tele-visit.  Patient Reported Readings (BP, Pulse, CBG, Weight, etc) none  Pain Assessment Pain : No/denies pain      Current Medications & Allergies (verified) Allergies as of 02/21/2021       Reactions   Fosamax [alendronate Sodium]    Lisinopril         Medication List        Accurate as of February 21, 2021  3:54 PM. If you have any questions, ask your nurse or doctor.          losartan 50 MG tablet Commonly known as: COZAAR Take 1 tablet (50 mg total) by mouth daily. NO REFILLS. NEEDS AN APPT TO TRANSITION CARE.   mirtazapine 15 MG tablet Commonly known as: REMERON Take 1 tablet (15 mg total) by mouth at bedtime.        History (reviewed): Past Medical History:  Diagnosis Date   Alzheimer's disease ([place]) 11/21/2013   MMSE 22/09 Mar 2012, outside records mention she was taking Namenda XR 28mg , unsure what caused d/c prior to transfer of care. Donepezil 10mg  intolerance.    Autoimmune hepatitis ([place]) 11/21/2013   Essential hypertension, benign 11/21/2013   Osteoporosis 11/21/2013   Type 2 diabetes mellitus ([place]) 11/21/2013   History reviewed. No pertinent surgical history. Family History  Problem Relation Age of Onset   Diabetes Unknown    Social History   Socioeconomic History   Marital status: Widowed    Spouse name: Not on file   Number of children: 4   Years of education: 3   Highest education level: 3rd grade  Occupational History   Occupation: Retired  Tobacco Use   Smoking status: Never   Smokeless tobacco: Never  Vaping Use   Vaping Use: Never used  Substance and Sexual Activity   Alcohol use: Never   Drug  use: No   Sexual activity: Never  Other Topics Concern   Not on file  Social History Narrative   Lives with her daughter. She had four children, one has deceased. She enjoys spending time with her famly.   Social Determinants of Health   Financial Resource Strain: Low Risk    Difficulty of Paying Living Expenses: Not hard at all  Food Insecurity: No Food Insecurity   Worried About Charity fundraiser in the Last Year: Never true   Independence in the Last Year: Never true  Transportation Needs: No Transportation Needs   Lack of Transportation (Medical): No   Lack of Transportation (Non-Medical): No  Physical Activity: Inactive   Days of Exercise per Week: 0 days   Minutes of Exercise per Session: 0 min  Stress: No Stress Concern Present   Feeling of Stress : Not at all  Social Connections: Socially Isolated   Frequency of Communication with Friends and Family: Once a week   Frequency of Social Gatherings with Friends and Family: More than three times a week   Attends Religious Services: Never   Marine scientist or Organizations: No   Attends Archivist Meetings: Never   Marital Status: Widowed    Activities of Daily Living In your present state of health, do you have any difficulty performing the following activities: 02/21/2021  Hearing? N  Vision? N  Difficulty concentrating or making decisions? Y  Comment yes, she has been experiencing  memory loss.  Walking or climbing stairs? N  Dressing or bathing? N  Comment needs encouragement to do it.  Doing errands, shopping? Y  Comment She doesn't drive.  Preparing Food and eating ? Y  Comment She is not able to prepare her on food.  Using the Toilet? N  In the past six months, have you accidently leaked urine? Y  Comment has had to use depends more often.  Do you have problems with loss of bowel control? Y  Comment has had to use depends more often.  Managing your Medications? Y  Comment her daughter has to hand them to her.  Managing your Finances? Y  Comment her daughter has to do her finances.  Housekeeping or managing your Housekeeping? Y  Comment her daughter has to help with her that.  Some recent data might be hidden    Patient Education/ Literacy How often do you need to have someone help you when you read instructions, pamphlets, or other written materials from your doctor or pharmacy?: 5 - Always What is the last grade level you  completed in school?: 3rd grade  Exercise Current Exercise Habits: The patient does not participate in regular exercise at present, Exercise limited by: neurologic condition(s)  Diet Patient reports consuming 3 meals a day and 2 snack(s) a day Patient reports that her primary diet is: Regular Patient reports that she does have regular access to food.   Depression Screen PHQ 2/9 Scores 02/21/2021 12/18/2017 08/02/2017 09/11/2016  PHQ - 2 Score 0 5 6 0  PHQ- 9 Score - 15 22 -     Fall Risk Fall Risk  02/21/2021 02/27/2018 09/11/2016 12/06/2015  Falls in the past year? 0 0 No Yes  Comment - Emmi Telephone Survey: data to providers prior to load - Emmi Telephone Survey: data to providers prior to load  Number falls in past yr: 0 - - 2 or more  Comment - - - Emmi Telephone Survey Actual Response =  2  Injury with Fall? 0 - - Yes  Risk for fall due to : No Fall Risks;Mental status change - - -  Follow up Falls evaluation completed;Education provided;Falls prevention discussed - - -     Objective:  Noya Cutright seemed alert and oriented and she participated appropriately during our telephone visit.  Blood Pressure Weight BMI  BP Readings from Last 3 Encounters:  02/16/20 127/83  05/21/18 139/83  12/18/17 140/83   Wt Readings from Last 3 Encounters:  02/16/20 145 lb 1.3 oz (65.8 kg)  05/21/18 155 lb 11.2 oz (70.6 kg)  12/18/17 158 lb 3.2 oz (71.8 kg)   BMI Readings from Last 1 Encounters:  02/16/20 24.14 kg/m    *Unable to obtain current vital signs, weight, and BMI due to telephone visit type  Hearing/Vision  Yareliz did not seem to have difficulty with hearing/understanding during the telephone conversation Reports that she has not had a formal eye exam by an eye care professional within the past year Reports that she has not had a formal hearing evaluation within the past year *Unable to fully assess hearing and vision during telephone visit type  Cognitive Function: 6CIT  Screen 02/21/2021  What Year? 4 points  What month? 3 points  What time? 3 points  Count back from 20 4 points  Months in reverse 4 points  Repeat phrase 10 points  Total Score 28   (Normal:0-7, Significant for Dysfunction: >8)  Normal Cognitive Function Screening: No: Patient has a history of Alzheimer's disease.    Immunization & Health Maintenance Record Immunization History  Administered Date(s) Administered   Fluad Quad(high Dose 65+) 02/16/2020   Influenza, High Dose Seasonal PF 12/06/2015, 12/27/2016, 02/23/2018   Influenza,inj,Quad PF,6+ Mos 02/24/2014, 03/31/2015   Influenza-Unspecified 11/29/2012   Pneumococcal Polysaccharide-23 07/05/2017   Pneumococcal-Unspecified 08/13/2006   Td 10/16/1995   Tdap 09/11/2016    Health Maintenance  Topic Date Due   OPHTHALMOLOGY EXAM  02/21/2021 (Originally 03/25/1941)   FOOT EXAM  02/23/2021 (Originally 03/25/1941)   COVID-19 Vaccine (1) 03/09/2021 (Originally 09/22/1931)   HEMOGLOBIN A1C  03/23/2021 (Originally 08/15/2020)   Zoster Vaccines- Shingrix (1 of 2) 05/24/2021 (Originally 03/25/1983)   INFLUENZA VACCINE  07/08/2021 (Originally 11/08/2020)   Pneumonia Vaccine 75+ Years old (2 - PCV) 02/21/2022 (Originally 07/06/2018)   TETANUS/TDAP  09/12/2026   DEXA SCAN  Completed   HPV VACCINES  Aged Out       Assessment  This is a routine wellness examination for [employer].  Health Maintenance: Due or Overdue There are no preventive care reminders to display for this patient.   Ramya Beldin does not need a referral for Community Assistance: Care Management:   no Social Work:    no Prescription Assistance:  no Nutrition/Diabetes Education:  no   Plan:  Personalized Goals  Goals Addressed               This Visit's Progress     Patient Stated (pt-stated)        02/21/2021 AWV Goal: Fall Prevention  Over the next year, patient will decrease their risk for falls by: Using assistive devices, such as a  cane or walker, as needed Identifying fall risks within their home and correcting them by: Removing throw rugs Adding handrails to stairs or ramps Removing clutter and keeping a clear pathway throughout the home Increasing light, especially at night Adding shower handles/bars Raising toilet seat Identifying potential personal risk factors for falls: Medication side effects Incontinence/urgency Vestibular dysfunction  Hearing loss Musculoskeletal disorders Neurological disorders Orthostatic hypotension         Personalized Health Maintenance & Screening Recommendations  Pneumococcal vaccine  Influenza vaccine Bone densitometry screening Shingrix vaccine Eye exam Foot exam Hemoglobin A1C  Lung Cancer Screening Recommended: no (Low Dose CT Chest recommended if Age 82-80 years, 30 pack-year currently smoking OR have quit w/in past 15 years) Hepatitis C Screening recommended: no HIV Screening recommended: no  Advanced Directives: Written information was not prepared per patient's request.  Referrals & Orders No orders of the defined types were placed in this encounter.   Follow-up Plan Follow-up with Danielle Reeve, DO as planned Schedule your Shingrix vaccine at the pharmacy.  Pneumonia and flu vaccine can be done in the office along with hemoglobin A1c and foot exam. Please schedule your eye exam. Medicare wellness visit in one year.  Let us know if you change your mind about the bone density.  AVS printed and mailed to the patient.   I have personally reviewed and noted the following in the patient's chart:   Medical and social history Use of alcohol, tobacco or illicit drugs  Current medications and supplements Functional ability and status Nutritional status Physical activity Advanced directives List of other physicians Hospitalizations, surgeries, and ER visits in previous 12 months Vitals Screenings to include cognitive, depression, and  falls Referrals and appointments  In addition, I have reviewed and discussed with Allanna Nelon certain preventive protocols, quality metrics, and best practice recommendations. A written personalized care plan for preventive services as well as general preventive health recommendations is available and can be mailed to the patient at her request.      Tinnie Gens, RN  02/21/2021

## 2021-02-23 ENCOUNTER — Encounter: Payer: Self-pay | Admitting: Family Medicine

## 2021-02-23 ENCOUNTER — Ambulatory Visit (INDEPENDENT_AMBULATORY_CARE_PROVIDER_SITE_OTHER): Payer: Medicare Other | Admitting: Family Medicine

## 2021-02-23 ENCOUNTER — Other Ambulatory Visit: Payer: Self-pay

## 2021-02-23 VITALS — BP 134/82 | HR 83 | Ht 61.0 in | Wt 140.0 lb

## 2021-02-23 DIAGNOSIS — E1129 Type 2 diabetes mellitus with other diabetic kidney complication: Secondary | ICD-10-CM

## 2021-02-23 DIAGNOSIS — F0280 Dementia in other diseases classified elsewhere without behavioral disturbance: Secondary | ICD-10-CM

## 2021-02-23 DIAGNOSIS — I10 Essential (primary) hypertension: Secondary | ICD-10-CM

## 2021-02-23 DIAGNOSIS — G4700 Insomnia, unspecified: Secondary | ICD-10-CM

## 2021-02-23 DIAGNOSIS — G309 Alzheimer's disease, unspecified: Secondary | ICD-10-CM

## 2021-02-23 DIAGNOSIS — E782 Mixed hyperlipidemia: Secondary | ICD-10-CM

## 2021-02-23 DIAGNOSIS — Z23 Encounter for immunization: Secondary | ICD-10-CM | POA: Diagnosis not present

## 2021-02-23 MED ORDER — MIRTAZAPINE 15 MG PO TABS: 15.0000 mg | ORAL_TABLET | Freq: Every day | ORAL | 3 refills | Status: DC

## 2021-02-23 MED ORDER — LOSARTAN POTASSIUM 50 MG PO TABS: 50.0000 mg | ORAL_TABLET | Freq: Every day | ORAL | 2 refills | Status: DC

## 2021-02-23 NOTE — Assessment & Plan Note (Signed)
Alzheimer's disease seems moderately advanced at this time as she is disoriented.  She remains pleasant without any behavioral issues.  She is currently living with her daughter who is her caregiver and assist with her ADLs.

## 2021-02-23 NOTE — Assessment & Plan Note (Signed)
This remains stable on mirtazapine.

## 2021-02-23 NOTE — Assessment & Plan Note (Signed)
Update A1c and renal function.

## 2021-02-23 NOTE — Progress Notes (Signed)
Danielle Mcdowell - 87 y.o. female MRN [id number]  Date of birth: 12/10/1930  Subjective Chief Complaint  Patient presents with   Annual Exam    HPI Danielle Mcdowell is a 87 year old female here today for follow-up visit.  She has history of hypertension, type 2 diabetes, Alzheimer's and insomnia.  She is accompanied today by her daughter.  She is doing okay at this time.  Her daughter reports that symptoms are stable at this time.  She was on several medications previously however family preferred a more conservative approach she is now only on losartan and mirtazapine.  She is sleeping as well as with diazepam.  Weight has been stable on this.   ROS:  A comprehensive ROS was completed and negative except as noted per HPI  Allergies  Allergen Reactions   Fosamax [Alendronate Sodium]    Lisinopril     Past Medical History:  Diagnosis Date   Alzheimer's disease ([place]) 11/21/2013   MMSE 22/09 Mar 2012, outside records mention she was taking Namenda XR 28mg , unsure what caused d/c prior to transfer of care. Donepezil 10mg  intolerance.    Autoimmune hepatitis ([place]) 11/21/2013   Essential hypertension, benign 11/21/2013   Osteoporosis 11/21/2013   Type 2 diabetes mellitus ([place]) 11/21/2013    History reviewed. No pertinent surgical history.  Social History   Socioeconomic History   Marital status: Widowed    Spouse name: Not on file   Number of children: 4   Years of education: 3   Highest education level: 3rd grade  Occupational History   Occupation: Retired  Tobacco Use   Smoking status: Never   Smokeless tobacco: Never  Vaping Use   Vaping Use: Never used  Substance and Sexual Activity   Alcohol use: Never   Drug use: No   Sexual activity: Never  Other Topics Concern   Not on file  Social History Narrative   Lives with her daughter. She had four children, one has deceased. She enjoys spending time with her famly.   Social Determinants of Health   Financial Resource  Strain: Low Risk    Difficulty of Paying Living Expenses: Not hard at all  Food Insecurity: No Food Insecurity   Worried About Charity fundraiser in the Last Year: Never true   [employer] in the Last Year: Never true  Transportation Needs: No Transportation Needs   Lack of Transportation (Medical): No   Lack of Transportation (Non-Medical): No  Physical Activity: Inactive   Days of Exercise per Week: 0 days   Minutes of Exercise per Session: 0 min  Stress: No Stress Concern Present   Feeling of Stress : Not at all  Social Connections: Socially Isolated   Frequency of Communication with Friends and Family: Once a week   Frequency of Social Gatherings with Friends and Family: More than three times a week   Attends Religious Services: Never   Marine scientist or Organizations: No   Attends Archivist Meetings: Never   Marital Status: Widowed    Family History  Problem Relation Age of Onset   Diabetes Unknown     Health Maintenance  Topic Date Due   OPHTHALMOLOGY EXAM  Never done   FOOT EXAM  02/23/2021 (Originally 03/25/1941)   COVID-19 Vaccine (1) 03/09/2021 (Originally 09/22/1931)   HEMOGLOBIN A1C  03/23/2021 (Originally 08/15/2020)   Zoster Vaccines- Shingrix (1 of 2) 05/24/2021 (Originally 03/25/1983)   TETANUS/TDAP  09/12/2026   Pneumonia Vaccine 34+ Years old  Completed   INFLUENZA VACCINE  Completed   DEXA SCAN  Completed   HPV VACCINES  Aged Out     ----------------------------------------------------------------------------------------------------------------------------------------------------------------------------------------------------------------- Physical Exam BP 134/82 (BP Location: Right Arm, Patient Position: Sitting, Cuff Size: Normal)   Pulse 83   Ht 5\' 1"  (1.549 m)   Wt 140 lb (63.5 kg)   SpO2 98%   BMI 26.45 kg/m   Physical Exam Constitutional:      Appearance: Normal appearance.  HENT:     Head: Normocephalic and  atraumatic.  Eyes:     General: No scleral icterus. Cardiovascular:     Rate and Rhythm: Normal rate and regular rhythm.  Pulmonary:     Effort: Pulmonary effort is normal.     Breath sounds: Normal breath sounds.  Musculoskeletal:     Cervical back: Neck supple.  Neurological:     Mental Status: She is alert. She is disoriented.     Motor: Motor function is intact.     Comments: She is very pleasant.  She is not oriented but this seems to be her baseline per her daughter.  Psychiatric:        Mood and Affect: Mood normal.        Behavior: Behavior normal.    ------------------------------------------------------------------------------------------------------------------------------------------------------------------------------------------------------------------- Assessment and Plan  Alzheimer's disease ([place]) Alzheimer's disease seems moderately advanced at this time as she is disoriented.  She remains pleasant without any behavioral issues.  She is currently living with her daughter who is her caregiver and assist with her ADLs.  Insomnia This remains stable on mirtazapine.  Type 2 diabetes mellitus with renal manifestations, controlled Update A1c and renal function.  Essential hypertension, benign Blood pressure remains well controlled at this time.  Continue losartan at current strength.   Meds ordered this encounter  Medications   losartan (COZAAR) 50 MG tablet    Sig: Take 1 tablet (50 mg total) by mouth daily.    Dispense:  90 tablet    Refill:  2   mirtazapine (REMERON) 15 MG tablet    Sig: Take 1 tablet (15 mg total) by mouth at bedtime.    Dispense:  90 tablet    Refill:  3    Return in about 6 months (around 08/23/2021) for HTN.    This visit occurred during the SARS-CoV-2 public health emergency.  Safety protocols were in place, including screening questions prior to the visit, additional usage of staff PPE, and extensive cleaning of exam room while  observing appropriate contact time as indicated for disinfecting solutions.

## 2021-02-23 NOTE — Assessment & Plan Note (Signed)
Blood pressure remains well controlled at this time.  Continue losartan at current strength.

## 2021-02-24 ENCOUNTER — Ambulatory Visit: Payer: Medicare Other | Admitting: Family Medicine

## 2021-02-24 LAB — CBC WITH DIFFERENTIAL/PLATELET
Absolute Monocytes: 517 cells/uL (ref 200–950)
Basophils Absolute: 57 cells/uL (ref 0–200)
Basophils Relative: 0.7 %
Eosinophils Absolute: 238 cells/uL (ref 15–500)
Eosinophils Relative: 2.9 %
HCT: 40 % (ref 35.0–45.0)
Hemoglobin: 13.3 g/dL (ref 11.7–15.5)
Lymphs Abs: 1714 cells/uL (ref 850–3900)
MCH: 30.5 pg (ref 27.0–33.0)
MCHC: 33.3 g/dL (ref 32.0–36.0)
MCV: 91.7 fL (ref 80.0–100.0)
MPV: 9.5 fL (ref 7.5–12.5)
Monocytes Relative: 6.3 %
Neutro Abs: 5674 cells/uL (ref 1500–7800)
Neutrophils Relative %: 69.2 %
Platelets: 232 10*3/uL (ref 140–400)
RBC: 4.36 10*6/uL (ref 3.80–5.10)
RDW: 11.9 % (ref 11.0–15.0)
Total Lymphocyte: 20.9 %
WBC: 8.2 10*3/uL (ref 3.8–10.8)

## 2021-02-24 LAB — COMPLETE METABOLIC PANEL WITH GFR
AG Ratio: 1.2 (calc) (ref 1.0–2.5)
ALT: 8 U/L (ref 6–29)
AST: 12 U/L (ref 10–35)
Albumin: 3.9 g/dL (ref 3.6–5.1)
Alkaline phosphatase (APISO): 79 U/L (ref 37–153)
BUN/Creatinine Ratio: 15 (calc) (ref 6–22)
BUN: 22 mg/dL (ref 7–25)
CO2: 23 mmol/L (ref 20–32)
Calcium: 9.6 mg/dL (ref 8.6–10.4)
Chloride: 107 mmol/L (ref 98–110)
Creat: 1.47 mg/dL — ABNORMAL HIGH (ref 0.60–0.95)
Globulin: 3.2 g/dL (calc) (ref 1.9–3.7)
Glucose, Bld: 107 mg/dL — ABNORMAL HIGH (ref 65–99)
Potassium: 4 mmol/L (ref 3.5–5.3)
Sodium: 140 mmol/L (ref 135–146)
Total Bilirubin: 0.6 mg/dL (ref 0.2–1.2)
Total Protein: 7.1 g/dL (ref 6.1–8.1)
eGFR: 34 mL/min/{1.73_m2} — ABNORMAL LOW (ref >=60)

## 2021-02-24 LAB — HEMOGLOBIN A1C
Hgb A1c MFr Bld: 6.1 % of total Hgb — ABNORMAL HIGH (ref <5.7)
Mean Plasma Glucose: 128 mg/dL
eAG (mmol/L): 7.1 mmol/L

## 2021-02-24 LAB — LIPID PANEL W/REFLEX DIRECT LDL
Cholesterol: 180 mg/dL (ref <200)
HDL: 65 mg/dL (ref >=50)
LDL Cholesterol (Calc): 97 mg/dL (calc)
Non-HDL Cholesterol (Calc): 115 mg/dL (calc) (ref <130)
Total CHOL/HDL Ratio: 2.8 (calc) (ref <5.0)
Triglycerides: 87 mg/dL (ref <150)

## 2021-06-02 DIAGNOSIS — F03911 Unspecified dementia, unspecified severity, with agitation: Secondary | ICD-10-CM | POA: Diagnosis not present

## 2021-06-02 DIAGNOSIS — G928 Other toxic encephalopathy: Secondary | ICD-10-CM | POA: Diagnosis present

## 2021-06-02 DIAGNOSIS — U099 Post covid-19 condition, unspecified: Secondary | ICD-10-CM | POA: Diagnosis not present

## 2021-06-02 DIAGNOSIS — R778 Other specified abnormalities of plasma proteins: Secondary | ICD-10-CM | POA: Diagnosis present

## 2021-06-02 DIAGNOSIS — L89210 Pressure ulcer of right hip, unstageable: Secondary | ICD-10-CM | POA: Diagnosis present

## 2021-06-02 DIAGNOSIS — N390 Urinary tract infection, site not specified: Secondary | ICD-10-CM | POA: Diagnosis present

## 2021-06-02 DIAGNOSIS — R531 Weakness: Secondary | ICD-10-CM | POA: Diagnosis not present

## 2021-06-02 DIAGNOSIS — I351 Nonrheumatic aortic (valve) insufficiency: Secondary | ICD-10-CM | POA: Diagnosis not present

## 2021-06-02 DIAGNOSIS — Z66 Do not resuscitate: Secondary | ICD-10-CM | POA: Diagnosis present

## 2021-06-02 DIAGNOSIS — R4182 Altered mental status, unspecified: Secondary | ICD-10-CM | POA: Diagnosis not present

## 2021-06-02 DIAGNOSIS — I950 Idiopathic hypotension: Secondary | ICD-10-CM | POA: Diagnosis not present

## 2021-06-02 DIAGNOSIS — E870 Hyperosmolality and hypernatremia: Secondary | ICD-10-CM | POA: Diagnosis present

## 2021-06-02 DIAGNOSIS — R630 Anorexia: Secondary | ICD-10-CM | POA: Diagnosis not present

## 2021-06-02 DIAGNOSIS — R739 Hyperglycemia, unspecified: Secondary | ICD-10-CM | POA: Diagnosis present

## 2021-06-02 DIAGNOSIS — Z79899 Other long term (current) drug therapy: Secondary | ICD-10-CM | POA: Diagnosis not present

## 2021-06-02 DIAGNOSIS — I10 Essential (primary) hypertension: Secondary | ICD-10-CM | POA: Diagnosis present

## 2021-06-02 DIAGNOSIS — Z8616 Personal history of COVID-19: Secondary | ICD-10-CM | POA: Diagnosis not present

## 2021-06-02 DIAGNOSIS — N179 Acute kidney failure, unspecified: Secondary | ICD-10-CM | POA: Diagnosis not present

## 2021-06-02 DIAGNOSIS — G309 Alzheimer's disease, unspecified: Secondary | ICD-10-CM | POA: Diagnosis present

## 2021-06-02 DIAGNOSIS — A419 Sepsis, unspecified organism: Secondary | ICD-10-CM | POA: Diagnosis not present

## 2021-06-02 DIAGNOSIS — J9691 Respiratory failure, unspecified with hypoxia: Secondary | ICD-10-CM | POA: Diagnosis present

## 2021-06-02 DIAGNOSIS — E860 Dehydration: Secondary | ICD-10-CM | POA: Diagnosis not present

## 2021-06-02 DIAGNOSIS — R6521 Severe sepsis with septic shock: Secondary | ICD-10-CM | POA: Diagnosis not present

## 2021-06-02 DIAGNOSIS — J189 Pneumonia, unspecified organism: Secondary | ICD-10-CM | POA: Diagnosis not present

## 2021-06-02 DIAGNOSIS — R0602 Shortness of breath: Secondary | ICD-10-CM | POA: Diagnosis not present

## 2021-06-02 DIAGNOSIS — F02811 Dementia in other diseases classified elsewhere, unspecified severity, with agitation: Secondary | ICD-10-CM | POA: Diagnosis present

## 2021-06-02 DIAGNOSIS — R627 Adult failure to thrive: Secondary | ICD-10-CM | POA: Diagnosis not present

## 2021-06-02 DIAGNOSIS — R6520 Severe sepsis without septic shock: Secondary | ICD-10-CM | POA: Diagnosis not present

## 2021-06-02 DIAGNOSIS — M7989 Other specified soft tissue disorders: Secondary | ICD-10-CM | POA: Diagnosis not present

## 2021-06-07 DIAGNOSIS — L89152 Pressure ulcer of sacral region, stage 2: Secondary | ICD-10-CM | POA: Diagnosis not present

## 2021-06-07 DIAGNOSIS — L89153 Pressure ulcer of sacral region, stage 3: Secondary | ICD-10-CM | POA: Diagnosis not present

## 2021-06-07 DIAGNOSIS — R739 Hyperglycemia, unspecified: Secondary | ICD-10-CM | POA: Diagnosis not present

## 2021-06-07 DIAGNOSIS — J189 Pneumonia, unspecified organism: Secondary | ICD-10-CM | POA: Diagnosis not present

## 2021-06-07 DIAGNOSIS — I959 Hypotension, unspecified: Secondary | ICD-10-CM | POA: Diagnosis not present

## 2021-06-07 DIAGNOSIS — R630 Anorexia: Secondary | ICD-10-CM | POA: Diagnosis not present

## 2021-06-07 DIAGNOSIS — L259 Unspecified contact dermatitis, unspecified cause: Secondary | ICD-10-CM | POA: Diagnosis not present

## 2021-06-07 DIAGNOSIS — F419 Anxiety disorder, unspecified: Secondary | ICD-10-CM | POA: Diagnosis not present

## 2021-06-07 DIAGNOSIS — R451 Restlessness and agitation: Secondary | ICD-10-CM | POA: Diagnosis not present

## 2021-06-07 DIAGNOSIS — R627 Adult failure to thrive: Secondary | ICD-10-CM | POA: Diagnosis not present

## 2021-06-07 DIAGNOSIS — N179 Acute kidney failure, unspecified: Secondary | ICD-10-CM | POA: Diagnosis not present

## 2021-06-07 DIAGNOSIS — N390 Urinary tract infection, site not specified: Secondary | ICD-10-CM | POA: Diagnosis not present

## 2021-06-07 DIAGNOSIS — A419 Sepsis, unspecified organism: Secondary | ICD-10-CM | POA: Diagnosis not present

## 2021-06-07 DIAGNOSIS — R531 Weakness: Secondary | ICD-10-CM | POA: Diagnosis not present

## 2021-06-07 DIAGNOSIS — I10 Essential (primary) hypertension: Secondary | ICD-10-CM | POA: Diagnosis not present

## 2021-06-07 DIAGNOSIS — R778 Other specified abnormalities of plasma proteins: Secondary | ICD-10-CM | POA: Diagnosis not present

## 2021-06-07 DIAGNOSIS — D649 Anemia, unspecified: Secondary | ICD-10-CM | POA: Diagnosis not present

## 2021-06-07 DIAGNOSIS — I950 Idiopathic hypotension: Secondary | ICD-10-CM | POA: Diagnosis not present

## 2021-06-07 DIAGNOSIS — G928 Other toxic encephalopathy: Secondary | ICD-10-CM | POA: Diagnosis not present

## 2021-06-07 DIAGNOSIS — R638 Other symptoms and signs concerning food and fluid intake: Secondary | ICD-10-CM | POA: Diagnosis not present

## 2021-06-07 DIAGNOSIS — L89214 Pressure ulcer of right hip, stage 4: Secondary | ICD-10-CM | POA: Diagnosis not present

## 2021-06-07 DIAGNOSIS — L89210 Pressure ulcer of right hip, unstageable: Secondary | ICD-10-CM | POA: Diagnosis not present

## 2021-06-07 DIAGNOSIS — Z79899 Other long term (current) drug therapy: Secondary | ICD-10-CM | POA: Diagnosis not present

## 2021-06-07 DIAGNOSIS — K529 Noninfective gastroenteritis and colitis, unspecified: Secondary | ICD-10-CM | POA: Diagnosis not present

## 2021-06-07 DIAGNOSIS — Z8616 Personal history of COVID-19: Secondary | ICD-10-CM | POA: Diagnosis not present

## 2021-06-07 DIAGNOSIS — E870 Hyperosmolality and hypernatremia: Secondary | ICD-10-CM | POA: Diagnosis not present

## 2021-06-07 DIAGNOSIS — J9691 Respiratory failure, unspecified with hypoxia: Secondary | ICD-10-CM | POA: Diagnosis not present

## 2021-06-07 DIAGNOSIS — F0390 Unspecified dementia without behavioral disturbance: Secondary | ICD-10-CM | POA: Diagnosis not present

## 2021-06-07 DIAGNOSIS — Z66 Do not resuscitate: Secondary | ICD-10-CM | POA: Diagnosis not present

## 2021-06-07 DIAGNOSIS — G309 Alzheimer's disease, unspecified: Secondary | ICD-10-CM | POA: Diagnosis not present

## 2021-06-07 DIAGNOSIS — F03911 Unspecified dementia, unspecified severity, with agitation: Secondary | ICD-10-CM | POA: Diagnosis not present

## 2021-06-07 DIAGNOSIS — G4700 Insomnia, unspecified: Secondary | ICD-10-CM | POA: Diagnosis not present

## 2021-06-07 DIAGNOSIS — F03918 Unspecified dementia, unspecified severity, with other behavioral disturbance: Secondary | ICD-10-CM | POA: Diagnosis not present

## 2021-06-07 DIAGNOSIS — L89219 Pressure ulcer of right hip, unspecified stage: Secondary | ICD-10-CM | POA: Diagnosis not present

## 2021-06-07 DIAGNOSIS — F02811 Dementia in other diseases classified elsewhere, unspecified severity, with agitation: Secondary | ICD-10-CM | POA: Diagnosis not present

## 2021-06-07 DIAGNOSIS — E876 Hypokalemia: Secondary | ICD-10-CM | POA: Diagnosis not present

## 2021-06-07 DIAGNOSIS — R6520 Severe sepsis without septic shock: Secondary | ICD-10-CM | POA: Diagnosis not present

## 2021-06-07 DIAGNOSIS — U099 Post covid-19 condition, unspecified: Secondary | ICD-10-CM | POA: Diagnosis not present

## 2021-06-08 DIAGNOSIS — R630 Anorexia: Secondary | ICD-10-CM | POA: Diagnosis not present

## 2021-06-08 DIAGNOSIS — A419 Sepsis, unspecified organism: Secondary | ICD-10-CM | POA: Diagnosis not present

## 2021-06-08 DIAGNOSIS — R451 Restlessness and agitation: Secondary | ICD-10-CM | POA: Diagnosis not present

## 2021-06-08 DIAGNOSIS — L89219 Pressure ulcer of right hip, unspecified stage: Secondary | ICD-10-CM | POA: Diagnosis not present

## 2021-06-08 DIAGNOSIS — F03918 Unspecified dementia, unspecified severity, with other behavioral disturbance: Secondary | ICD-10-CM | POA: Diagnosis not present

## 2021-06-08 DIAGNOSIS — I959 Hypotension, unspecified: Secondary | ICD-10-CM | POA: Diagnosis not present

## 2021-06-08 DIAGNOSIS — R627 Adult failure to thrive: Secondary | ICD-10-CM | POA: Diagnosis not present

## 2021-06-09 DIAGNOSIS — L89210 Pressure ulcer of right hip, unstageable: Secondary | ICD-10-CM | POA: Diagnosis not present

## 2021-06-09 DIAGNOSIS — F0390 Unspecified dementia without behavioral disturbance: Secondary | ICD-10-CM | POA: Diagnosis not present

## 2021-06-09 DIAGNOSIS — L259 Unspecified contact dermatitis, unspecified cause: Secondary | ICD-10-CM | POA: Diagnosis not present

## 2021-06-09 DIAGNOSIS — I10 Essential (primary) hypertension: Secondary | ICD-10-CM | POA: Diagnosis not present

## 2021-06-09 DIAGNOSIS — G4700 Insomnia, unspecified: Secondary | ICD-10-CM | POA: Diagnosis not present

## 2021-06-09 DIAGNOSIS — R638 Other symptoms and signs concerning food and fluid intake: Secondary | ICD-10-CM | POA: Diagnosis not present

## 2021-06-10 DIAGNOSIS — E876 Hypokalemia: Secondary | ICD-10-CM | POA: Diagnosis not present

## 2021-06-10 DIAGNOSIS — D649 Anemia, unspecified: Secondary | ICD-10-CM | POA: Diagnosis not present

## 2021-06-15 DIAGNOSIS — F03918 Unspecified dementia, unspecified severity, with other behavioral disturbance: Secondary | ICD-10-CM | POA: Diagnosis not present

## 2021-06-16 DIAGNOSIS — L89214 Pressure ulcer of right hip, stage 4: Secondary | ICD-10-CM | POA: Diagnosis not present

## 2021-06-23 DIAGNOSIS — L89152 Pressure ulcer of sacral region, stage 2: Secondary | ICD-10-CM | POA: Diagnosis not present

## 2021-06-23 DIAGNOSIS — K529 Noninfective gastroenteritis and colitis, unspecified: Secondary | ICD-10-CM | POA: Diagnosis not present

## 2021-06-23 DIAGNOSIS — L89214 Pressure ulcer of right hip, stage 4: Secondary | ICD-10-CM | POA: Diagnosis not present

## 2021-06-29 DIAGNOSIS — F419 Anxiety disorder, unspecified: Secondary | ICD-10-CM | POA: Diagnosis not present

## 2021-06-29 DIAGNOSIS — R451 Restlessness and agitation: Secondary | ICD-10-CM | POA: Diagnosis not present

## 2021-06-30 DIAGNOSIS — L89153 Pressure ulcer of sacral region, stage 3: Secondary | ICD-10-CM | POA: Diagnosis not present

## 2021-06-30 DIAGNOSIS — L89214 Pressure ulcer of right hip, stage 4: Secondary | ICD-10-CM | POA: Diagnosis not present

## 2021-07-05 DIAGNOSIS — N390 Urinary tract infection, site not specified: Secondary | ICD-10-CM | POA: Diagnosis not present

## 2021-07-07 DIAGNOSIS — L89214 Pressure ulcer of right hip, stage 4: Secondary | ICD-10-CM | POA: Diagnosis not present

## 2021-07-07 DIAGNOSIS — L89153 Pressure ulcer of sacral region, stage 3: Secondary | ICD-10-CM | POA: Diagnosis not present

## 2021-07-13 DIAGNOSIS — I10 Essential (primary) hypertension: Secondary | ICD-10-CM | POA: Diagnosis not present

## 2021-07-13 DIAGNOSIS — L89159 Pressure ulcer of sacral region, unspecified stage: Secondary | ICD-10-CM | POA: Diagnosis not present

## 2021-07-13 DIAGNOSIS — R627 Adult failure to thrive: Secondary | ICD-10-CM | POA: Diagnosis not present

## 2021-07-13 DIAGNOSIS — L89219 Pressure ulcer of right hip, unspecified stage: Secondary | ICD-10-CM | POA: Diagnosis not present

## 2021-07-13 DIAGNOSIS — F0390 Unspecified dementia without behavioral disturbance: Secondary | ICD-10-CM | POA: Diagnosis not present

## 2021-07-13 DIAGNOSIS — R630 Anorexia: Secondary | ICD-10-CM | POA: Diagnosis not present

## 2021-07-14 DIAGNOSIS — D649 Anemia, unspecified: Secondary | ICD-10-CM | POA: Diagnosis not present

## 2021-07-14 DIAGNOSIS — L89890 Pressure ulcer of other site, unstageable: Secondary | ICD-10-CM | POA: Diagnosis not present

## 2021-07-14 DIAGNOSIS — L89153 Pressure ulcer of sacral region, stage 3: Secondary | ICD-10-CM | POA: Diagnosis not present

## 2021-07-14 DIAGNOSIS — L89214 Pressure ulcer of right hip, stage 4: Secondary | ICD-10-CM | POA: Diagnosis not present

## 2021-07-19 DIAGNOSIS — R051 Acute cough: Secondary | ICD-10-CM | POA: Diagnosis not present

## 2021-07-19 DIAGNOSIS — D649 Anemia, unspecified: Secondary | ICD-10-CM | POA: Diagnosis not present

## 2021-07-19 DIAGNOSIS — L89159 Pressure ulcer of sacral region, unspecified stage: Secondary | ICD-10-CM | POA: Diagnosis not present

## 2021-07-19 DIAGNOSIS — D72829 Elevated white blood cell count, unspecified: Secondary | ICD-10-CM | POA: Diagnosis not present

## 2021-07-19 DIAGNOSIS — L89219 Pressure ulcer of right hip, unspecified stage: Secondary | ICD-10-CM | POA: Diagnosis not present

## 2021-07-21 DIAGNOSIS — L89890 Pressure ulcer of other site, unstageable: Secondary | ICD-10-CM | POA: Diagnosis not present

## 2021-07-21 DIAGNOSIS — L89214 Pressure ulcer of right hip, stage 4: Secondary | ICD-10-CM | POA: Diagnosis not present

## 2021-07-21 DIAGNOSIS — L89153 Pressure ulcer of sacral region, stage 3: Secondary | ICD-10-CM | POA: Diagnosis not present

## 2021-07-28 DIAGNOSIS — L89890 Pressure ulcer of other site, unstageable: Secondary | ICD-10-CM | POA: Diagnosis not present

## 2021-07-28 DIAGNOSIS — L89153 Pressure ulcer of sacral region, stage 3: Secondary | ICD-10-CM | POA: Diagnosis not present

## 2021-08-01 DIAGNOSIS — N390 Urinary tract infection, site not specified: Secondary | ICD-10-CM | POA: Diagnosis not present

## 2021-08-03 DIAGNOSIS — R5383 Other fatigue: Secondary | ICD-10-CM | POA: Diagnosis not present

## 2021-08-03 DIAGNOSIS — D649 Anemia, unspecified: Secondary | ICD-10-CM | POA: Diagnosis not present

## 2021-08-03 DIAGNOSIS — R051 Acute cough: Secondary | ICD-10-CM | POA: Diagnosis not present

## 2021-08-04 DIAGNOSIS — M6281 Muscle weakness (generalized): Secondary | ICD-10-CM | POA: Diagnosis not present

## 2021-08-04 DIAGNOSIS — R630 Anorexia: Secondary | ICD-10-CM | POA: Diagnosis not present

## 2021-08-04 DIAGNOSIS — Z515 Encounter for palliative care: Secondary | ICD-10-CM | POA: Diagnosis not present

## 2021-08-04 DIAGNOSIS — R1312 Dysphagia, oropharyngeal phase: Secondary | ICD-10-CM | POA: Diagnosis not present

## 2021-08-04 DIAGNOSIS — E1165 Type 2 diabetes mellitus with hyperglycemia: Secondary | ICD-10-CM | POA: Diagnosis not present

## 2021-08-04 DIAGNOSIS — L89150 Pressure ulcer of sacral region, unstageable: Secondary | ICD-10-CM | POA: Diagnosis not present

## 2021-08-04 DIAGNOSIS — D649 Anemia, unspecified: Secondary | ICD-10-CM | POA: Diagnosis not present

## 2021-08-04 DIAGNOSIS — F02C0 Dementia in other diseases classified elsewhere, severe, without behavioral disturbance, psychotic disturbance, mood disturbance, and anxiety: Secondary | ICD-10-CM | POA: Diagnosis not present

## 2021-08-04 DIAGNOSIS — L89154 Pressure ulcer of sacral region, stage 4: Secondary | ICD-10-CM | POA: Diagnosis not present

## 2021-08-04 DIAGNOSIS — D720 Genetic anomalies of leukocytes: Secondary | ICD-10-CM | POA: Diagnosis not present

## 2021-08-04 DIAGNOSIS — R7402 Elevation of levels of lactic acid dehydrogenase (LDH): Secondary | ICD-10-CM | POA: Diagnosis not present

## 2021-08-04 DIAGNOSIS — F03C11 Unspecified dementia, severe, with agitation: Secondary | ICD-10-CM | POA: Diagnosis not present

## 2021-08-04 DIAGNOSIS — R5381 Other malaise: Secondary | ICD-10-CM | POA: Diagnosis not present

## 2021-08-04 DIAGNOSIS — A419 Sepsis, unspecified organism: Secondary | ICD-10-CM | POA: Diagnosis not present

## 2021-08-04 DIAGNOSIS — I10 Essential (primary) hypertension: Secondary | ICD-10-CM | POA: Diagnosis not present

## 2021-08-04 DIAGNOSIS — Z8616 Personal history of COVID-19: Secondary | ICD-10-CM | POA: Diagnosis not present

## 2021-08-04 DIAGNOSIS — Z1612 Extended spectrum beta lactamase (ESBL) resistance: Secondary | ICD-10-CM | POA: Diagnosis not present

## 2021-08-04 DIAGNOSIS — M1990 Unspecified osteoarthritis, unspecified site: Secondary | ICD-10-CM | POA: Diagnosis not present

## 2021-08-04 DIAGNOSIS — L8996 Pressure-induced deep tissue damage of unspecified site: Secondary | ICD-10-CM | POA: Diagnosis not present

## 2021-08-04 DIAGNOSIS — N289 Disorder of kidney and ureter, unspecified: Secondary | ICD-10-CM | POA: Diagnosis not present

## 2021-08-04 DIAGNOSIS — Z7409 Other reduced mobility: Secondary | ICD-10-CM | POA: Diagnosis not present

## 2021-08-04 DIAGNOSIS — R509 Fever, unspecified: Secondary | ICD-10-CM | POA: Diagnosis not present

## 2021-08-04 DIAGNOSIS — L89522 Pressure ulcer of left ankle, stage 2: Secondary | ICD-10-CM | POA: Diagnosis not present

## 2021-08-04 DIAGNOSIS — L98492 Non-pressure chronic ulcer of skin of other sites with fat layer exposed: Secondary | ICD-10-CM | POA: Diagnosis not present

## 2021-08-04 DIAGNOSIS — F03911 Unspecified dementia, unspecified severity, with agitation: Secondary | ICD-10-CM | POA: Diagnosis not present

## 2021-08-04 DIAGNOSIS — E441 Mild protein-calorie malnutrition: Secondary | ICD-10-CM | POA: Diagnosis not present

## 2021-08-04 DIAGNOSIS — E119 Type 2 diabetes mellitus without complications: Secondary | ICD-10-CM | POA: Diagnosis not present

## 2021-08-04 DIAGNOSIS — R627 Adult failure to thrive: Secondary | ICD-10-CM | POA: Diagnosis not present

## 2021-08-04 DIAGNOSIS — E871 Hypo-osmolality and hyponatremia: Secondary | ICD-10-CM | POA: Diagnosis not present

## 2021-08-04 DIAGNOSIS — R638 Other symptoms and signs concerning food and fluid intake: Secondary | ICD-10-CM | POA: Diagnosis not present

## 2021-08-04 DIAGNOSIS — Z20822 Contact with and (suspected) exposure to covid-19: Secondary | ICD-10-CM | POA: Diagnosis not present

## 2021-08-04 DIAGNOSIS — I950 Idiopathic hypotension: Secondary | ICD-10-CM | POA: Diagnosis not present

## 2021-08-04 DIAGNOSIS — R739 Hyperglycemia, unspecified: Secondary | ICD-10-CM | POA: Diagnosis not present

## 2021-08-04 DIAGNOSIS — R531 Weakness: Secondary | ICD-10-CM | POA: Diagnosis not present

## 2021-08-04 DIAGNOSIS — E870 Hyperosmolality and hypernatremia: Secondary | ICD-10-CM | POA: Diagnosis not present

## 2021-08-04 DIAGNOSIS — U099 Post covid-19 condition, unspecified: Secondary | ICD-10-CM | POA: Diagnosis not present

## 2021-08-04 DIAGNOSIS — F0280 Dementia in other diseases classified elsewhere without behavioral disturbance: Secondary | ICD-10-CM | POA: Diagnosis not present

## 2021-08-04 DIAGNOSIS — R5383 Other fatigue: Secondary | ICD-10-CM | POA: Diagnosis not present

## 2021-08-04 DIAGNOSIS — L89526 Pressure-induced deep tissue damage of left ankle: Secondary | ICD-10-CM | POA: Diagnosis not present

## 2021-08-04 DIAGNOSIS — K5900 Constipation, unspecified: Secondary | ICD-10-CM | POA: Diagnosis not present

## 2021-08-04 DIAGNOSIS — L89214 Pressure ulcer of right hip, stage 4: Secondary | ICD-10-CM | POA: Diagnosis not present

## 2021-08-04 DIAGNOSIS — L89890 Pressure ulcer of other site, unstageable: Secondary | ICD-10-CM | POA: Diagnosis not present

## 2021-08-04 DIAGNOSIS — E860 Dehydration: Secondary | ICD-10-CM | POA: Diagnosis not present

## 2021-08-04 DIAGNOSIS — L89153 Pressure ulcer of sacral region, stage 3: Secondary | ICD-10-CM | POA: Diagnosis not present

## 2021-08-04 DIAGNOSIS — L89222 Pressure ulcer of left hip, stage 2: Secondary | ICD-10-CM | POA: Diagnosis not present

## 2021-08-04 DIAGNOSIS — L89520 Pressure ulcer of left ankle, unstageable: Secondary | ICD-10-CM | POA: Diagnosis not present

## 2021-08-04 DIAGNOSIS — L89219 Pressure ulcer of right hip, unspecified stage: Secondary | ICD-10-CM | POA: Diagnosis not present

## 2021-08-04 DIAGNOSIS — E872 Acidosis, unspecified: Secondary | ICD-10-CM | POA: Diagnosis not present

## 2021-08-04 DIAGNOSIS — G309 Alzheimer's disease, unspecified: Secondary | ICD-10-CM | POA: Diagnosis not present

## 2021-08-04 DIAGNOSIS — D72829 Elevated white blood cell count, unspecified: Secondary | ICD-10-CM | POA: Diagnosis not present

## 2021-08-04 DIAGNOSIS — N179 Acute kidney failure, unspecified: Secondary | ICD-10-CM | POA: Diagnosis not present

## 2021-08-04 DIAGNOSIS — Z741 Need for assistance with personal care: Secondary | ICD-10-CM | POA: Diagnosis not present

## 2021-08-04 DIAGNOSIS — Z66 Do not resuscitate: Secondary | ICD-10-CM | POA: Diagnosis not present

## 2021-08-04 DIAGNOSIS — B9620 Unspecified Escherichia coli [E. coli] as the cause of diseases classified elsewhere: Secondary | ICD-10-CM | POA: Diagnosis not present

## 2021-08-08 DIAGNOSIS — L89520 Pressure ulcer of left ankle, unstageable: Secondary | ICD-10-CM | POA: Diagnosis not present

## 2021-08-08 DIAGNOSIS — I10 Essential (primary) hypertension: Secondary | ICD-10-CM | POA: Diagnosis not present

## 2021-08-08 DIAGNOSIS — F0280 Dementia in other diseases classified elsewhere without behavioral disturbance: Secondary | ICD-10-CM | POA: Diagnosis not present

## 2021-08-08 DIAGNOSIS — R630 Anorexia: Secondary | ICD-10-CM | POA: Diagnosis not present

## 2021-08-08 DIAGNOSIS — L89890 Pressure ulcer of other site, unstageable: Secondary | ICD-10-CM | POA: Diagnosis not present

## 2021-08-08 DIAGNOSIS — M1990 Unspecified osteoarthritis, unspecified site: Secondary | ICD-10-CM | POA: Diagnosis not present

## 2021-08-08 DIAGNOSIS — E871 Hypo-osmolality and hyponatremia: Secondary | ICD-10-CM | POA: Diagnosis not present

## 2021-08-08 DIAGNOSIS — L89522 Pressure ulcer of left ankle, stage 2: Secondary | ICD-10-CM | POA: Diagnosis not present

## 2021-08-08 DIAGNOSIS — D720 Genetic anomalies of leukocytes: Secondary | ICD-10-CM | POA: Diagnosis not present

## 2021-08-08 DIAGNOSIS — E870 Hyperosmolality and hypernatremia: Secondary | ICD-10-CM | POA: Diagnosis not present

## 2021-08-08 DIAGNOSIS — R1312 Dysphagia, oropharyngeal phase: Secondary | ICD-10-CM | POA: Diagnosis not present

## 2021-08-08 DIAGNOSIS — L89214 Pressure ulcer of right hip, stage 4: Secondary | ICD-10-CM | POA: Diagnosis not present

## 2021-08-08 DIAGNOSIS — L89150 Pressure ulcer of sacral region, unstageable: Secondary | ICD-10-CM | POA: Diagnosis not present

## 2021-08-08 DIAGNOSIS — L89222 Pressure ulcer of left hip, stage 2: Secondary | ICD-10-CM | POA: Diagnosis not present

## 2021-08-08 DIAGNOSIS — R627 Adult failure to thrive: Secondary | ICD-10-CM | POA: Diagnosis not present

## 2021-08-08 DIAGNOSIS — I950 Idiopathic hypotension: Secondary | ICD-10-CM | POA: Diagnosis not present

## 2021-08-08 DIAGNOSIS — Z7409 Other reduced mobility: Secondary | ICD-10-CM | POA: Diagnosis not present

## 2021-08-08 DIAGNOSIS — L89526 Pressure-induced deep tissue damage of left ankle: Secondary | ICD-10-CM | POA: Diagnosis not present

## 2021-08-08 DIAGNOSIS — M6281 Muscle weakness (generalized): Secondary | ICD-10-CM | POA: Diagnosis not present

## 2021-08-08 DIAGNOSIS — R638 Other symptoms and signs concerning food and fluid intake: Secondary | ICD-10-CM | POA: Diagnosis not present

## 2021-08-08 DIAGNOSIS — L8996 Pressure-induced deep tissue damage of unspecified site: Secondary | ICD-10-CM | POA: Diagnosis not present

## 2021-08-08 DIAGNOSIS — D72829 Elevated white blood cell count, unspecified: Secondary | ICD-10-CM | POA: Diagnosis not present

## 2021-08-08 DIAGNOSIS — Z1612 Extended spectrum beta lactamase (ESBL) resistance: Secondary | ICD-10-CM | POA: Diagnosis not present

## 2021-08-08 DIAGNOSIS — E119 Type 2 diabetes mellitus without complications: Secondary | ICD-10-CM | POA: Diagnosis not present

## 2021-08-08 DIAGNOSIS — L89154 Pressure ulcer of sacral region, stage 4: Secondary | ICD-10-CM | POA: Diagnosis not present

## 2021-08-08 DIAGNOSIS — N390 Urinary tract infection, site not specified: Secondary | ICD-10-CM | POA: Diagnosis not present

## 2021-08-08 DIAGNOSIS — R531 Weakness: Secondary | ICD-10-CM | POA: Diagnosis not present

## 2021-08-08 DIAGNOSIS — U099 Post covid-19 condition, unspecified: Secondary | ICD-10-CM | POA: Diagnosis not present

## 2021-08-08 DIAGNOSIS — E441 Mild protein-calorie malnutrition: Secondary | ICD-10-CM | POA: Diagnosis not present

## 2021-08-08 DIAGNOSIS — G309 Alzheimer's disease, unspecified: Secondary | ICD-10-CM | POA: Diagnosis not present

## 2021-08-08 DIAGNOSIS — F03911 Unspecified dementia, unspecified severity, with agitation: Secondary | ICD-10-CM | POA: Diagnosis not present

## 2021-08-08 DIAGNOSIS — L89153 Pressure ulcer of sacral region, stage 3: Secondary | ICD-10-CM | POA: Diagnosis not present

## 2021-08-08 DIAGNOSIS — K5900 Constipation, unspecified: Secondary | ICD-10-CM | POA: Diagnosis not present

## 2021-08-08 DIAGNOSIS — Z741 Need for assistance with personal care: Secondary | ICD-10-CM | POA: Diagnosis not present

## 2021-08-08 DIAGNOSIS — F0390 Unspecified dementia without behavioral disturbance: Secondary | ICD-10-CM | POA: Diagnosis not present

## 2021-08-08 DIAGNOSIS — L89159 Pressure ulcer of sacral region, unspecified stage: Secondary | ICD-10-CM | POA: Diagnosis not present

## 2021-08-08 DIAGNOSIS — F03C11 Unspecified dementia, severe, with agitation: Secondary | ICD-10-CM | POA: Diagnosis not present

## 2021-08-08 DIAGNOSIS — D649 Anemia, unspecified: Secondary | ICD-10-CM | POA: Diagnosis not present

## 2021-08-09 DIAGNOSIS — L89159 Pressure ulcer of sacral region, unspecified stage: Secondary | ICD-10-CM | POA: Diagnosis not present

## 2021-08-09 DIAGNOSIS — F0390 Unspecified dementia without behavioral disturbance: Secondary | ICD-10-CM | POA: Diagnosis not present

## 2021-08-09 DIAGNOSIS — R630 Anorexia: Secondary | ICD-10-CM | POA: Diagnosis not present

## 2021-08-09 DIAGNOSIS — R627 Adult failure to thrive: Secondary | ICD-10-CM | POA: Diagnosis not present

## 2021-08-11 DIAGNOSIS — R627 Adult failure to thrive: Secondary | ICD-10-CM | POA: Diagnosis not present

## 2021-08-11 DIAGNOSIS — E871 Hypo-osmolality and hyponatremia: Secondary | ICD-10-CM | POA: Diagnosis not present

## 2021-08-11 DIAGNOSIS — D72829 Elevated white blood cell count, unspecified: Secondary | ICD-10-CM | POA: Diagnosis not present

## 2021-08-11 DIAGNOSIS — F0390 Unspecified dementia without behavioral disturbance: Secondary | ICD-10-CM | POA: Diagnosis not present

## 2021-08-19 DIAGNOSIS — M6281 Muscle weakness (generalized): Secondary | ICD-10-CM | POA: Diagnosis not present

## 2021-08-19 DIAGNOSIS — L89154 Pressure ulcer of sacral region, stage 4: Secondary | ICD-10-CM | POA: Diagnosis not present

## 2021-08-19 DIAGNOSIS — E441 Mild protein-calorie malnutrition: Secondary | ICD-10-CM | POA: Diagnosis not present

## 2021-08-19 DIAGNOSIS — F03C11 Unspecified dementia, severe, with agitation: Secondary | ICD-10-CM | POA: Diagnosis not present

## 2021-08-23 ENCOUNTER — Ambulatory Visit: Payer: Medicare Other | Admitting: Family Medicine

## 2021-09-08 DIAGNOSIS — U099 Post covid-19 condition, unspecified: Secondary | ICD-10-CM | POA: Diagnosis not present

## 2021-09-08 DIAGNOSIS — R630 Anorexia: Secondary | ICD-10-CM | POA: Diagnosis not present

## 2021-09-08 DIAGNOSIS — M6281 Muscle weakness (generalized): Secondary | ICD-10-CM | POA: Diagnosis not present

## 2021-09-08 DIAGNOSIS — Z1612 Extended spectrum beta lactamase (ESBL) resistance: Secondary | ICD-10-CM | POA: Diagnosis not present

## 2021-09-08 DIAGNOSIS — R627 Adult failure to thrive: Secondary | ICD-10-CM | POA: Diagnosis not present

## 2021-09-08 DIAGNOSIS — F03C11 Unspecified dementia, severe, with agitation: Secondary | ICD-10-CM | POA: Diagnosis not present

## 2021-09-08 DIAGNOSIS — G309 Alzheimer's disease, unspecified: Secondary | ICD-10-CM | POA: Diagnosis not present

## 2021-09-08 DIAGNOSIS — E870 Hyperosmolality and hypernatremia: Secondary | ICD-10-CM | POA: Diagnosis not present

## 2021-09-08 DIAGNOSIS — R1312 Dysphagia, oropharyngeal phase: Secondary | ICD-10-CM | POA: Diagnosis not present

## 2021-09-08 DIAGNOSIS — L89522 Pressure ulcer of left ankle, stage 2: Secondary | ICD-10-CM | POA: Diagnosis not present

## 2021-09-08 DIAGNOSIS — L89154 Pressure ulcer of sacral region, stage 4: Secondary | ICD-10-CM | POA: Diagnosis not present

## 2021-09-08 DIAGNOSIS — N390 Urinary tract infection, site not specified: Secondary | ICD-10-CM | POA: Diagnosis not present

## 2021-09-08 DIAGNOSIS — L89214 Pressure ulcer of right hip, stage 4: Secondary | ICD-10-CM | POA: Diagnosis not present

## 2021-09-08 DIAGNOSIS — L8996 Pressure-induced deep tissue damage of unspecified site: Secondary | ICD-10-CM | POA: Diagnosis not present

## 2021-09-08 DIAGNOSIS — F03911 Unspecified dementia, unspecified severity, with agitation: Secondary | ICD-10-CM | POA: Diagnosis not present

## 2021-09-08 DIAGNOSIS — E441 Mild protein-calorie malnutrition: Secondary | ICD-10-CM | POA: Diagnosis not present

## 2021-09-08 DIAGNOSIS — M1990 Unspecified osteoarthritis, unspecified site: Secondary | ICD-10-CM | POA: Diagnosis not present

## 2021-09-08 DIAGNOSIS — I10 Essential (primary) hypertension: Secondary | ICD-10-CM | POA: Diagnosis not present

## 2021-09-08 DIAGNOSIS — I950 Idiopathic hypotension: Secondary | ICD-10-CM | POA: Diagnosis not present

## 2021-09-08 DIAGNOSIS — L89520 Pressure ulcer of left ankle, unstageable: Secondary | ICD-10-CM | POA: Diagnosis not present

## 2021-09-08 DIAGNOSIS — K5900 Constipation, unspecified: Secondary | ICD-10-CM | POA: Diagnosis not present

## 2021-09-08 DIAGNOSIS — D649 Anemia, unspecified: Secondary | ICD-10-CM | POA: Diagnosis not present

## 2021-09-08 DIAGNOSIS — Z741 Need for assistance with personal care: Secondary | ICD-10-CM | POA: Diagnosis not present

## 2021-09-08 DIAGNOSIS — L89890 Pressure ulcer of other site, unstageable: Secondary | ICD-10-CM | POA: Diagnosis not present

## 2021-09-08 DIAGNOSIS — L89153 Pressure ulcer of sacral region, stage 3: Secondary | ICD-10-CM | POA: Diagnosis not present

## 2021-09-20 DIAGNOSIS — F03C11 Unspecified dementia, severe, with agitation: Secondary | ICD-10-CM | POA: Diagnosis not present

## 2021-09-20 DIAGNOSIS — L89154 Pressure ulcer of sacral region, stage 4: Secondary | ICD-10-CM | POA: Diagnosis not present

## 2021-09-20 DIAGNOSIS — E441 Mild protein-calorie malnutrition: Secondary | ICD-10-CM | POA: Diagnosis not present

## 2021-09-20 DIAGNOSIS — M6281 Muscle weakness (generalized): Secondary | ICD-10-CM | POA: Diagnosis not present

## 2021-10-03 DIAGNOSIS — N390 Urinary tract infection, site not specified: Secondary | ICD-10-CM | POA: Diagnosis not present

## 2021-10-17 DIAGNOSIS — M6281 Muscle weakness (generalized): Secondary | ICD-10-CM | POA: Diagnosis not present

## 2021-10-17 DIAGNOSIS — F03911 Unspecified dementia, unspecified severity, with agitation: Secondary | ICD-10-CM | POA: Diagnosis not present

## 2021-10-17 DIAGNOSIS — L89214 Pressure ulcer of right hip, stage 4: Secondary | ICD-10-CM | POA: Diagnosis not present

## 2021-10-18 DIAGNOSIS — F03911 Unspecified dementia, unspecified severity, with agitation: Secondary | ICD-10-CM | POA: Diagnosis not present

## 2021-10-18 DIAGNOSIS — L89214 Pressure ulcer of right hip, stage 4: Secondary | ICD-10-CM | POA: Diagnosis not present

## 2021-10-18 DIAGNOSIS — M6281 Muscle weakness (generalized): Secondary | ICD-10-CM | POA: Diagnosis not present

## 2021-10-19 DIAGNOSIS — F03911 Unspecified dementia, unspecified severity, with agitation: Secondary | ICD-10-CM | POA: Diagnosis not present

## 2021-10-19 DIAGNOSIS — L89214 Pressure ulcer of right hip, stage 4: Secondary | ICD-10-CM | POA: Diagnosis not present

## 2021-10-19 DIAGNOSIS — M6281 Muscle weakness (generalized): Secondary | ICD-10-CM | POA: Diagnosis not present

## 2021-10-20 DIAGNOSIS — L89214 Pressure ulcer of right hip, stage 4: Secondary | ICD-10-CM | POA: Diagnosis not present

## 2021-10-20 DIAGNOSIS — F03911 Unspecified dementia, unspecified severity, with agitation: Secondary | ICD-10-CM | POA: Diagnosis not present

## 2021-10-20 DIAGNOSIS — M6281 Muscle weakness (generalized): Secondary | ICD-10-CM | POA: Diagnosis not present

## 2021-10-21 DIAGNOSIS — M6281 Muscle weakness (generalized): Secondary | ICD-10-CM | POA: Diagnosis not present

## 2021-10-21 DIAGNOSIS — F03911 Unspecified dementia, unspecified severity, with agitation: Secondary | ICD-10-CM | POA: Diagnosis not present

## 2021-10-21 DIAGNOSIS — L89214 Pressure ulcer of right hip, stage 4: Secondary | ICD-10-CM | POA: Diagnosis not present

## 2021-10-22 DIAGNOSIS — L89214 Pressure ulcer of right hip, stage 4: Secondary | ICD-10-CM | POA: Diagnosis not present

## 2021-10-22 DIAGNOSIS — F03911 Unspecified dementia, unspecified severity, with agitation: Secondary | ICD-10-CM | POA: Diagnosis not present

## 2021-10-22 DIAGNOSIS — M6281 Muscle weakness (generalized): Secondary | ICD-10-CM | POA: Diagnosis not present

## 2021-10-23 DIAGNOSIS — F03911 Unspecified dementia, unspecified severity, with agitation: Secondary | ICD-10-CM | POA: Diagnosis not present

## 2021-10-23 DIAGNOSIS — M6281 Muscle weakness (generalized): Secondary | ICD-10-CM | POA: Diagnosis not present

## 2021-10-23 DIAGNOSIS — L89214 Pressure ulcer of right hip, stage 4: Secondary | ICD-10-CM | POA: Diagnosis not present

## 2021-10-24 DIAGNOSIS — M6281 Muscle weakness (generalized): Secondary | ICD-10-CM | POA: Diagnosis not present

## 2021-10-24 DIAGNOSIS — F03911 Unspecified dementia, unspecified severity, with agitation: Secondary | ICD-10-CM | POA: Diagnosis not present

## 2021-10-24 DIAGNOSIS — L89214 Pressure ulcer of right hip, stage 4: Secondary | ICD-10-CM | POA: Diagnosis not present

## 2021-10-25 DIAGNOSIS — M6281 Muscle weakness (generalized): Secondary | ICD-10-CM | POA: Diagnosis not present

## 2021-10-25 DIAGNOSIS — L89214 Pressure ulcer of right hip, stage 4: Secondary | ICD-10-CM | POA: Diagnosis not present

## 2021-10-25 DIAGNOSIS — F03911 Unspecified dementia, unspecified severity, with agitation: Secondary | ICD-10-CM | POA: Diagnosis not present

## 2021-10-26 DIAGNOSIS — F03911 Unspecified dementia, unspecified severity, with agitation: Secondary | ICD-10-CM | POA: Diagnosis not present

## 2021-10-26 DIAGNOSIS — M6281 Muscle weakness (generalized): Secondary | ICD-10-CM | POA: Diagnosis not present

## 2021-10-26 DIAGNOSIS — L89214 Pressure ulcer of right hip, stage 4: Secondary | ICD-10-CM | POA: Diagnosis not present

## 2021-10-27 DIAGNOSIS — M6281 Muscle weakness (generalized): Secondary | ICD-10-CM | POA: Diagnosis not present

## 2021-10-27 DIAGNOSIS — L89214 Pressure ulcer of right hip, stage 4: Secondary | ICD-10-CM | POA: Diagnosis not present

## 2021-10-27 DIAGNOSIS — F03911 Unspecified dementia, unspecified severity, with agitation: Secondary | ICD-10-CM | POA: Diagnosis not present

## 2021-10-29 DIAGNOSIS — L89214 Pressure ulcer of right hip, stage 4: Secondary | ICD-10-CM | POA: Diagnosis not present

## 2021-10-29 DIAGNOSIS — F03911 Unspecified dementia, unspecified severity, with agitation: Secondary | ICD-10-CM | POA: Diagnosis not present

## 2021-10-29 DIAGNOSIS — M6281 Muscle weakness (generalized): Secondary | ICD-10-CM | POA: Diagnosis not present

## 2021-10-31 DIAGNOSIS — L89214 Pressure ulcer of right hip, stage 4: Secondary | ICD-10-CM | POA: Diagnosis not present

## 2021-10-31 DIAGNOSIS — M6281 Muscle weakness (generalized): Secondary | ICD-10-CM | POA: Diagnosis not present

## 2021-10-31 DIAGNOSIS — F03911 Unspecified dementia, unspecified severity, with agitation: Secondary | ICD-10-CM | POA: Diagnosis not present

## 2021-11-01 DIAGNOSIS — F03911 Unspecified dementia, unspecified severity, with agitation: Secondary | ICD-10-CM | POA: Diagnosis not present

## 2021-11-01 DIAGNOSIS — M6281 Muscle weakness (generalized): Secondary | ICD-10-CM | POA: Diagnosis not present

## 2021-11-01 DIAGNOSIS — L89214 Pressure ulcer of right hip, stage 4: Secondary | ICD-10-CM | POA: Diagnosis not present

## 2021-11-02 DIAGNOSIS — L89214 Pressure ulcer of right hip, stage 4: Secondary | ICD-10-CM | POA: Diagnosis not present

## 2021-11-02 DIAGNOSIS — F03911 Unspecified dementia, unspecified severity, with agitation: Secondary | ICD-10-CM | POA: Diagnosis not present

## 2021-11-02 DIAGNOSIS — M6281 Muscle weakness (generalized): Secondary | ICD-10-CM | POA: Diagnosis not present

## 2021-11-03 DIAGNOSIS — M6281 Muscle weakness (generalized): Secondary | ICD-10-CM | POA: Diagnosis not present

## 2021-11-03 DIAGNOSIS — F03911 Unspecified dementia, unspecified severity, with agitation: Secondary | ICD-10-CM | POA: Diagnosis not present

## 2021-11-03 DIAGNOSIS — L89214 Pressure ulcer of right hip, stage 4: Secondary | ICD-10-CM | POA: Diagnosis not present

## 2021-11-04 DIAGNOSIS — L89214 Pressure ulcer of right hip, stage 4: Secondary | ICD-10-CM | POA: Diagnosis not present

## 2021-11-04 DIAGNOSIS — F03911 Unspecified dementia, unspecified severity, with agitation: Secondary | ICD-10-CM | POA: Diagnosis not present

## 2021-11-04 DIAGNOSIS — N390 Urinary tract infection, site not specified: Secondary | ICD-10-CM | POA: Diagnosis not present

## 2021-11-04 DIAGNOSIS — M6281 Muscle weakness (generalized): Secondary | ICD-10-CM | POA: Diagnosis not present

## 2021-11-04 DIAGNOSIS — F03C11 Unspecified dementia, severe, with agitation: Secondary | ICD-10-CM | POA: Diagnosis not present

## 2021-11-04 DIAGNOSIS — E441 Mild protein-calorie malnutrition: Secondary | ICD-10-CM | POA: Diagnosis not present

## 2021-11-07 DIAGNOSIS — L89214 Pressure ulcer of right hip, stage 4: Secondary | ICD-10-CM | POA: Diagnosis not present

## 2021-11-07 DIAGNOSIS — F03911 Unspecified dementia, unspecified severity, with agitation: Secondary | ICD-10-CM | POA: Diagnosis not present

## 2021-11-07 DIAGNOSIS — M6281 Muscle weakness (generalized): Secondary | ICD-10-CM | POA: Diagnosis not present

## 2021-11-08 DIAGNOSIS — M6281 Muscle weakness (generalized): Secondary | ICD-10-CM | POA: Diagnosis not present

## 2021-11-08 DIAGNOSIS — L89214 Pressure ulcer of right hip, stage 4: Secondary | ICD-10-CM | POA: Diagnosis not present

## 2021-11-08 DIAGNOSIS — F03911 Unspecified dementia, unspecified severity, with agitation: Secondary | ICD-10-CM | POA: Diagnosis not present

## 2021-11-09 DIAGNOSIS — M6281 Muscle weakness (generalized): Secondary | ICD-10-CM | POA: Diagnosis not present

## 2021-11-09 DIAGNOSIS — L89214 Pressure ulcer of right hip, stage 4: Secondary | ICD-10-CM | POA: Diagnosis not present

## 2021-11-09 DIAGNOSIS — F03911 Unspecified dementia, unspecified severity, with agitation: Secondary | ICD-10-CM | POA: Diagnosis not present

## 2021-11-10 DIAGNOSIS — M6281 Muscle weakness (generalized): Secondary | ICD-10-CM | POA: Diagnosis not present

## 2021-11-10 DIAGNOSIS — L89214 Pressure ulcer of right hip, stage 4: Secondary | ICD-10-CM | POA: Diagnosis not present

## 2021-11-10 DIAGNOSIS — F03911 Unspecified dementia, unspecified severity, with agitation: Secondary | ICD-10-CM | POA: Diagnosis not present

## 2021-11-11 DIAGNOSIS — M6281 Muscle weakness (generalized): Secondary | ICD-10-CM | POA: Diagnosis not present

## 2021-11-11 DIAGNOSIS — F03911 Unspecified dementia, unspecified severity, with agitation: Secondary | ICD-10-CM | POA: Diagnosis not present

## 2021-11-11 DIAGNOSIS — L89214 Pressure ulcer of right hip, stage 4: Secondary | ICD-10-CM | POA: Diagnosis not present

## 2021-11-12 DIAGNOSIS — M6281 Muscle weakness (generalized): Secondary | ICD-10-CM | POA: Diagnosis not present

## 2021-11-12 DIAGNOSIS — L89214 Pressure ulcer of right hip, stage 4: Secondary | ICD-10-CM | POA: Diagnosis not present

## 2021-11-12 DIAGNOSIS — F03911 Unspecified dementia, unspecified severity, with agitation: Secondary | ICD-10-CM | POA: Diagnosis not present

## 2021-11-14 DIAGNOSIS — E441 Mild protein-calorie malnutrition: Secondary | ICD-10-CM | POA: Diagnosis not present

## 2021-11-14 DIAGNOSIS — F03911 Unspecified dementia, unspecified severity, with agitation: Secondary | ICD-10-CM | POA: Diagnosis not present

## 2021-11-14 DIAGNOSIS — L89214 Pressure ulcer of right hip, stage 4: Secondary | ICD-10-CM | POA: Diagnosis not present

## 2021-11-14 DIAGNOSIS — L89154 Pressure ulcer of sacral region, stage 4: Secondary | ICD-10-CM | POA: Diagnosis not present

## 2021-11-14 DIAGNOSIS — F03C11 Unspecified dementia, severe, with agitation: Secondary | ICD-10-CM | POA: Diagnosis not present

## 2021-11-14 DIAGNOSIS — M6281 Muscle weakness (generalized): Secondary | ICD-10-CM | POA: Diagnosis not present

## 2021-11-15 DIAGNOSIS — M6281 Muscle weakness (generalized): Secondary | ICD-10-CM | POA: Diagnosis not present

## 2021-11-15 DIAGNOSIS — F03911 Unspecified dementia, unspecified severity, with agitation: Secondary | ICD-10-CM | POA: Diagnosis not present

## 2021-11-15 DIAGNOSIS — L89214 Pressure ulcer of right hip, stage 4: Secondary | ICD-10-CM | POA: Diagnosis not present

## 2021-11-16 DIAGNOSIS — F03911 Unspecified dementia, unspecified severity, with agitation: Secondary | ICD-10-CM | POA: Diagnosis not present

## 2021-11-16 DIAGNOSIS — L89214 Pressure ulcer of right hip, stage 4: Secondary | ICD-10-CM | POA: Diagnosis not present

## 2021-11-16 DIAGNOSIS — M6281 Muscle weakness (generalized): Secondary | ICD-10-CM | POA: Diagnosis not present

## 2021-11-17 DIAGNOSIS — L89214 Pressure ulcer of right hip, stage 4: Secondary | ICD-10-CM | POA: Diagnosis not present

## 2021-11-17 DIAGNOSIS — M6281 Muscle weakness (generalized): Secondary | ICD-10-CM | POA: Diagnosis not present

## 2021-11-17 DIAGNOSIS — F03911 Unspecified dementia, unspecified severity, with agitation: Secondary | ICD-10-CM | POA: Diagnosis not present

## 2021-11-18 DIAGNOSIS — L89214 Pressure ulcer of right hip, stage 4: Secondary | ICD-10-CM | POA: Diagnosis not present

## 2021-11-18 DIAGNOSIS — M6281 Muscle weakness (generalized): Secondary | ICD-10-CM | POA: Diagnosis not present

## 2021-11-18 DIAGNOSIS — F03911 Unspecified dementia, unspecified severity, with agitation: Secondary | ICD-10-CM | POA: Diagnosis not present

## 2021-11-19 DIAGNOSIS — M6281 Muscle weakness (generalized): Secondary | ICD-10-CM | POA: Diagnosis not present

## 2021-11-19 DIAGNOSIS — F03911 Unspecified dementia, unspecified severity, with agitation: Secondary | ICD-10-CM | POA: Diagnosis not present

## 2021-11-19 DIAGNOSIS — L89214 Pressure ulcer of right hip, stage 4: Secondary | ICD-10-CM | POA: Diagnosis not present

## 2021-11-21 DIAGNOSIS — M6281 Muscle weakness (generalized): Secondary | ICD-10-CM | POA: Diagnosis not present

## 2021-11-21 DIAGNOSIS — L89214 Pressure ulcer of right hip, stage 4: Secondary | ICD-10-CM | POA: Diagnosis not present

## 2021-11-21 DIAGNOSIS — F03911 Unspecified dementia, unspecified severity, with agitation: Secondary | ICD-10-CM | POA: Diagnosis not present

## 2021-11-22 DIAGNOSIS — L89214 Pressure ulcer of right hip, stage 4: Secondary | ICD-10-CM | POA: Diagnosis not present

## 2021-11-22 DIAGNOSIS — M6281 Muscle weakness (generalized): Secondary | ICD-10-CM | POA: Diagnosis not present

## 2021-11-22 DIAGNOSIS — F03911 Unspecified dementia, unspecified severity, with agitation: Secondary | ICD-10-CM | POA: Diagnosis not present

## 2021-11-23 DIAGNOSIS — L89214 Pressure ulcer of right hip, stage 4: Secondary | ICD-10-CM | POA: Diagnosis not present

## 2021-11-23 DIAGNOSIS — F03911 Unspecified dementia, unspecified severity, with agitation: Secondary | ICD-10-CM | POA: Diagnosis not present

## 2021-11-23 DIAGNOSIS — M6281 Muscle weakness (generalized): Secondary | ICD-10-CM | POA: Diagnosis not present

## 2021-11-24 DIAGNOSIS — M6281 Muscle weakness (generalized): Secondary | ICD-10-CM | POA: Diagnosis not present

## 2021-11-24 DIAGNOSIS — F03911 Unspecified dementia, unspecified severity, with agitation: Secondary | ICD-10-CM | POA: Diagnosis not present

## 2021-11-24 DIAGNOSIS — L89214 Pressure ulcer of right hip, stage 4: Secondary | ICD-10-CM | POA: Diagnosis not present

## 2021-11-25 DIAGNOSIS — F03911 Unspecified dementia, unspecified severity, with agitation: Secondary | ICD-10-CM | POA: Diagnosis not present

## 2021-11-25 DIAGNOSIS — M6281 Muscle weakness (generalized): Secondary | ICD-10-CM | POA: Diagnosis not present

## 2021-11-25 DIAGNOSIS — L89214 Pressure ulcer of right hip, stage 4: Secondary | ICD-10-CM | POA: Diagnosis not present

## 2021-12-01 DIAGNOSIS — F03C11 Unspecified dementia, severe, with agitation: Secondary | ICD-10-CM | POA: Diagnosis not present

## 2021-12-01 DIAGNOSIS — E441 Mild protein-calorie malnutrition: Secondary | ICD-10-CM | POA: Diagnosis not present

## 2021-12-01 DIAGNOSIS — L89154 Pressure ulcer of sacral region, stage 4: Secondary | ICD-10-CM | POA: Diagnosis not present

## 2021-12-01 DIAGNOSIS — M6281 Muscle weakness (generalized): Secondary | ICD-10-CM | POA: Diagnosis not present

## 2022-01-08 DEATH — deceased

## 2022-07-10 ENCOUNTER — Encounter: Payer: Self-pay | Admitting: Family Medicine
